# Patient Record
Sex: Male | Born: 1965 | Race: Black or African American | Hispanic: No | Marital: Married | Smoking: Current some day smoker
Health system: Southern US, Community
[De-identification: ages and names within clinical notes are randomized; demographics above are authoritative.]

## PROBLEM LIST (undated history)

## (undated) DIAGNOSIS — M25462 Effusion, left knee: Secondary | ICD-10-CM

## (undated) DIAGNOSIS — M109 Gout, unspecified: Secondary | ICD-10-CM

## (undated) HISTORY — PX: ANTERIOR CRUCIATE LIGAMENT REPAIR: SHX115

---

## 2009-04-03 ENCOUNTER — Emergency Department (HOSPITAL_BASED_OUTPATIENT_CLINIC_OR_DEPARTMENT_OTHER): Admission: EM | Admit: 2009-04-03 | Discharge: 2009-04-03 | Payer: Self-pay | Admitting: Emergency Medicine

## 2009-05-23 ENCOUNTER — Ambulatory Visit: Payer: Self-pay | Admitting: Diagnostic Radiology

## 2009-05-23 ENCOUNTER — Emergency Department (HOSPITAL_BASED_OUTPATIENT_CLINIC_OR_DEPARTMENT_OTHER): Admission: EM | Admit: 2009-05-23 | Discharge: 2009-05-23 | Payer: Self-pay | Admitting: Emergency Medicine

## 2009-07-01 ENCOUNTER — Ambulatory Visit: Payer: Self-pay | Admitting: Diagnostic Radiology

## 2009-07-01 ENCOUNTER — Emergency Department (HOSPITAL_BASED_OUTPATIENT_CLINIC_OR_DEPARTMENT_OTHER): Admission: EM | Admit: 2009-07-01 | Discharge: 2009-07-01 | Payer: Self-pay | Admitting: Emergency Medicine

## 2009-09-01 ENCOUNTER — Emergency Department (HOSPITAL_BASED_OUTPATIENT_CLINIC_OR_DEPARTMENT_OTHER): Admission: EM | Admit: 2009-09-01 | Discharge: 2009-09-01 | Payer: Self-pay | Admitting: Emergency Medicine

## 2010-04-25 LAB — BODY FLUID CULTURE: Culture: NO GROWTH

## 2010-04-25 LAB — SYNOVIAL CELL COUNT + DIFF, W/ CRYSTALS
Monocyte-Macrophage-Synovial Fluid: 1 % — ABNORMAL LOW (ref 50–90)
Neutrophil, Synovial: 99 % — ABNORMAL HIGH (ref 0–25)

## 2010-04-25 LAB — GRAM STAIN

## 2011-04-13 ENCOUNTER — Emergency Department (INDEPENDENT_AMBULATORY_CARE_PROVIDER_SITE_OTHER): Payer: Self-pay

## 2011-04-13 ENCOUNTER — Encounter (HOSPITAL_BASED_OUTPATIENT_CLINIC_OR_DEPARTMENT_OTHER): Payer: Self-pay | Admitting: *Deleted

## 2011-04-13 ENCOUNTER — Emergency Department (HOSPITAL_BASED_OUTPATIENT_CLINIC_OR_DEPARTMENT_OTHER)
Admission: EM | Admit: 2011-04-13 | Discharge: 2011-04-13 | Disposition: A | Discharge status: Home / Self Care | Payer: Self-pay | Attending: Emergency Medicine | Admitting: Emergency Medicine

## 2011-04-13 DIAGNOSIS — R911 Solitary pulmonary nodule: Secondary | ICD-10-CM | POA: Insufficient documentation

## 2011-04-13 DIAGNOSIS — J40 Bronchitis, not specified as acute or chronic: Secondary | ICD-10-CM | POA: Insufficient documentation

## 2011-04-13 DIAGNOSIS — R05 Cough: Secondary | ICD-10-CM

## 2011-04-13 DIAGNOSIS — R079 Chest pain, unspecified: Secondary | ICD-10-CM

## 2011-04-13 DIAGNOSIS — R062 Wheezing: Secondary | ICD-10-CM | POA: Insufficient documentation

## 2011-04-13 DIAGNOSIS — R059 Cough, unspecified: Secondary | ICD-10-CM | POA: Insufficient documentation

## 2011-04-13 DIAGNOSIS — R0602 Shortness of breath: Secondary | ICD-10-CM | POA: Insufficient documentation

## 2011-04-13 DIAGNOSIS — R0989 Other specified symptoms and signs involving the circulatory and respiratory systems: Secondary | ICD-10-CM

## 2011-04-13 DIAGNOSIS — J3489 Other specified disorders of nose and nasal sinuses: Secondary | ICD-10-CM | POA: Insufficient documentation

## 2011-04-13 MED ORDER — IBUPROFEN 400 MG PO TABS
600.0000 mg | ORAL_TABLET | Freq: Once | ORAL | Status: AC
  Administered 2011-04-13: 600 mg via ORAL
  Filled 2011-04-13: qty 1

## 2011-04-13 MED ORDER — ALBUTEROL SULFATE (5 MG/ML) 0.5% IN NEBU
5.0000 mg | INHALATION_SOLUTION | Freq: Once | RESPIRATORY_TRACT | Status: AC
  Administered 2011-04-13: 5 mg via RESPIRATORY_TRACT
  Filled 2011-04-13: qty 1

## 2011-04-13 MED ORDER — ALBUTEROL SULFATE HFA 108 (90 BASE) MCG/ACT IN AERS: 2.0000 | INHALATION_SPRAY | RESPIRATORY_TRACT | Status: AC | PRN

## 2011-04-13 MED ORDER — HYDROCODONE-ACETAMINOPHEN 5-500 MG PO TABS: 1.0000 | ORAL_TABLET | Freq: Four times a day (QID) | ORAL | Status: AC | PRN

## 2011-04-13 MED ORDER — HYDROCODONE-ACETAMINOPHEN 5-325 MG PO TABS
1.0000 | ORAL_TABLET | Freq: Once | ORAL | Status: AC
  Administered 2011-04-13: 1 via ORAL
  Filled 2011-04-13: qty 1

## 2011-04-13 MED ORDER — AZITHROMYCIN 250 MG PO TABS: ORAL_TABLET | ORAL | Status: AC

## 2011-04-13 NOTE — ED Notes (Signed)
Patient states he developed intermittent right upper abdominal pain with radiation into right mid back one week ago.  States pain is now a constant increasing pain which is causing shortness of breath, worse when laying down.  States he has a productive cough with light yellow secretions.  States he had a fever when the pain first started for several days.

## 2011-04-13 NOTE — Discharge Instructions (Signed)
Take antibiotic as prescribed. Use albuterol inhaler as need if wheezing. Take motrin as need. You may also take vicodin as need for pain. No driving for the next 6 hours or when taking vicodin. Also, do not take tylenol or acetaminophen containing medication when taking vicodin.  On your chest xray, our radiologist also made note of a 1.5 cm right lung nodule.  You must follow up closely with primary care doctor in the next couple weeks - discuss the lung nodule and have them arrange a CT scan to further evaulate, and for further follow up (in order to make sure it appears benign, and if appears concerning for malignancy/cancer, to refer to specialist for further evaluation).  Also have them recheck your blood pressure as it is mildly high today.    Return to ER if worse, worsening or severe pain, trouble breathing, other concern.      Bronchitis Bronchitis is the body's way of reacting to injury and/or infection (inflammation) of the bronchi. Bronchi are the air tubes that extend from the windpipe into the lungs. If the inflammation becomes severe, it may cause shortness of breath. CAUSES  Inflammation may be caused by:  A virus.   Germs (bacteria).   Dust.   Allergens.   Pollutants and many other irritants.  The cells lining the bronchial tree are covered with tiny hairs (cilia). These constantly beat upward, away from the lungs, toward the mouth. This keeps the lungs free of pollutants. When these cells become too irritated and are unable to do their job, mucus begins to develop. This causes the characteristic cough of bronchitis. The cough clears the lungs when the cilia are unable to do their job. Without either of these protective mechanisms, the mucus would settle in the lungs. Then you would develop pneumonia. Smoking is a common cause of bronchitis and can contribute to pneumonia. Stopping this habit is the single most important thing you can do to help yourself. TREATMENT    Your caregiver may prescribe an antibiotic if the cough is caused by bacteria. Also, medicines that open up your airways make it easier to breathe. Your caregiver may also recommend or prescribe an expectorant. It will loosen the mucus to be coughed up. Only take over-the-counter or prescription medicines for pain, discomfort, or fever as directed by your caregiver.   Removing whatever causes the problem (smoking, for example) is critical to preventing the problem from getting worse.   Cough suppressants may be prescribed for relief of cough symptoms.   Inhaled medicines may be prescribed to help with symptoms now and to help prevent problems from returning.   For those with recurrent (chronic) bronchitis, there may be a need for steroid medicines.  SEEK IMMEDIATE MEDICAL CARE IF:   During treatment, you develop more pus-like mucus (purulent sputum).   You have a fever.   Your baby is older than 3 months with a rectal temperature of 102 F (38.9 C) or higher.   Your baby is 74 months old or younger with a rectal temperature of 100.4 F (38 C) or higher.   You become progressively more ill.   You have increased difficulty breathing, wheezing, or shortness of breath.  It is necessary to seek immediate medical care if you are elderly or sick from any other disease. MAKE SURE YOU:   Understand these instructions.   Will watch your condition.   Will get help right away if you are not doing well or get worse.  Document Released:  01/23/2005 Document Revised: 01/12/2011 Document Reviewed: 12/03/2007 [facility] Patient Information 2012 [place], [place].    Pulmonary Nodule A pulmonary (lung) nodule is small, round growth in the lung. The size of a pulmonary nodule can be as small as a pencil eraser (1/5 inch or 4 millmeters) to a little bigger than your biggest toenail (1 inch or 25 millimeters). A pulmonary nodule is usually an unplanned finding. It may be found on a chest X-ray or a  computed tomography (CT) scan when you have imaging tests of your lungs done. When a pulmonary nodule is found, tests will be done to determine if the nodule is benign (not cancerous) or malignant (cancerous). Follow-up treatment or testing is based on the size of the pulmonary nodule and your risk of getting lung cancer.  CAUSES Causes of pulmonary nodules can vary.  Benign pulmonary nodules  can be caused from different things. Some of these things include:  Infection. This can be a common cause of a benign pulmonary nodule. The infection may be active (a current infection) or an old infection that is no longer active. Three types of infections can cause a pulmonary nodule. These are:   Bacterial Infection.   Fungal infection.   Viral Infections.   Hematoma. This is a bruise in the lung. A hematoma can happen from an injury to your chest.   Some common diseases can lead to benign pulmonary nodules. For example, rheumatoid arthritis can be a cause of a pulmonary nodule.   Other unusual things can cause a benign pulmonary nodule. These can include:   Having had tuberculosis.   Rare diseases, such as a lung cyst.  Malignant pulmonary nodules.  These are cancerous growths. The cancer may have:  Started in the lung. Some lung cancers first detected as a pulmonary nodule.   Spread to the lung from cancer somewhere else in the body. This is called metastatic cancer.   Certain risk factors make a cancerous pulmonary nodule more likely. They include:   Age. As people get older, a pulmonary nodule is more likely to be cancerous.   Cancer history. If one of your immediate family members has had cancer, you have a higher risk of developing cancer.   Smoking. This includes people who currently smoke and those who have quit.  DIAGNOSIS To diagnose whether a pulmonary nodule is benign or malignant, a variety of tests will be done. This includes things such as:  Health history. Questions  regarding your current health, past health, and family health will be asked.   Blood tests. Results of blood work can show:   Tumor markers for cancer.   Any type of infection.   A skin test called a tuberculin (TB) test may be done. This test can tell if you have been exposed to the germ that causes tuberculosis.   Imaging tests. These take pictures of your lungs. Types of imaging tests include:   Chest X-ray. This can help in several ways. An X-ray gives a close-up look at the pulmonary nodule. A new X-ray can be compared with any X-rays you have had in the past.    Computed tomography  (CT) scan. This test shows smaller pulmonary nodules more clearly than an X-ray.   Positron emission tomography  (PET) scan. This is a test that uses a radioactive substance to identify a pulmonary nodule. A safe amount of radioactive substance is injected into the blood stream. Then, the scan takes a picture of the pulmonary nodule. A malignant  pulmonary nodule will absorb the substance faster than a benign pulmonary nodule. The radioactive substance is eliminated from your body in your urine.   Biopsy.  This removes a tiny piece of the pulmonary nodule so it can be checked under a microscope. Medicine will be given to help keep you relaxed and pain free when a biopsy is done. Types of biopsies include:   Bronchoscopy . This is a surgical procedure. It can be used for pulmonary nodules that are close to the airways in the lung. It uses a scope (a thin tube) with a tiny camera and light on the end. The scope is put in the windpipe. Your caregiver can then see inside the lung. A tiny tool put through the scope is used to take a small sample of the pulmonary nodule tissue.   Transthoracic needle aspiration . This method is used if the pulmonary nodule is far away from the air passages in the lung. A long, thin needle is put through the chest into the lung nodule. A CT scan is done at the same time which can  make it easier to locate the pulmonary nodule.   Surgical lung biopsy . This is a surgical procedure in which the pulmonary nodule is removed. This is usually recommended when the pulmonary nodule is most likely malignant or a biopsy cannot be obtained by either bronchoscopy or transthoracic needle aspiration.  PULMONARY NODULE FOLLOW-UP RECOMMENDATIONS The frequency of pulmonary nodule follow-up is based on your risk factors and size of the pulmonary nodule. If your caregiver suspects the pulmonary nodule is cancerous or the pulmonary nodule changes during any of the follow-up CT scans, additional testing or biopsies will be done.   If you have no or low risk of getting lung cancer (non-smoker, no personal cancer history), recommended follow-up is based on the following pulmonary nodule size:   A pulmonary nodule that is < 4 mm does not require any follow-up.   A pulmonary nodule that is 4 to 6 mm should be re-imaged by CT scan in 12 months.   A pulmonary nodule that is 6 to 8 mm should be re-imaged by CT scan at 6 to 12 months and then again at 18 to 24 months if no change in size.   A pulmonary nodule > 8 mm in size should be followed closely and re-imaged by CT scan at 3, 9, and 24 months.    If you are at risk of getting lung cancer (current or former smoker, family history of cancer), recommended follow-up is based on the following pulmonary nodule size:   A pulmonary nodule that is < 4 mm in size should be re-imaged by CT scan in 12 months.   A pulmonary nodule that is 4 to 6 mm in size should be re-imaged by CT scan at 6 to 12 months and again at 18 to 24 months.   A pulmonary nodule that is 6 to 8 mm in size should be re-imaged by CT scan at 3, 9, and 24 months.   A pulmonary nodule > 8 mm in size should be followed closely and re-imaged by CT scan at 3, 9, and 24 months.  SEEK MEDICAL CARE IF: While waiting for test results to determine what type of pulmonary nodule you have, be  sure to contact your caregiver if you:  Have trouble breathing when you are active.   Feel sick or unusually tired.   Do not feel like eating.   Lose weight  without trying to.   Develop chills or night sweats.   Mild or moderate fevers generally have no long-term effects and often do not require treatment. There are a few exceptions (see below).  SEEK IMMEDIATE MEDICAL CARE IF:  You cannot catch your breath or you begin wheezing.   You cannot stop coughing.   You cough up blood.   You feel like you are going to pass out or become dizzy.   You have sudden chest pain.   You have a fever or persistent symptoms for more than 72 hours.   You have a fever and your symptoms suddenly get worse.  MAKE SURE YOU   Understand these instructions.   Will watch your condition.   Will get help right away if you are not doing well or get worse.  Document Released: 11/20/2008 Document Revised: 01/12/2011 Document Reviewed: 11/20/2008 [facility] Patient Information 2012 [place], [place].

## 2011-04-13 NOTE — ED Provider Notes (Signed)
History     CSN: [id number]  Arrival date & time 04/13/11  1742   First MD Initiated Contact with Patient 04/13/11 1812      Chief Complaint  Patient presents with  . Shortness of Breath    (Consider location/radiation/quality/duration/timing/severity/associated sxs/prior treatment) Patient is a 46 y.o. male presenting with shortness of breath. The history is provided by the patient.  Shortness of Breath  Associated symptoms include cough. Pertinent negatives include no chest pain and no fever.  pt c/o  productive cough in past week, yellowish phlegm. Congestion. No fever. States now when coughs right lateral chest/rib pain. Worse w cough and movement, dull. No sob, but feels wheezy. No hx asthma. Non smoker. Denies sore throat, runny nose or other uri c/o. No known ill contacts.  Denies ant chest pain or discomfort. No leg pain or swelling. No dvt or pe hx. No abd pain. No nv.   History reviewed. No pertinent past medical history.  Past Surgical History  Procedure Date  . Anterior cruciate ligament repair     No family history on file.  History  Substance Use Topics  . Smoking status: Never Smoker   . Smokeless tobacco: Not on file  . Alcohol Use: 1.8 oz/week    3 Cans of beer per week     daily      Review of Systems  Constitutional: Negative for fever.  HENT: Negative for neck pain and neck stiffness.   Eyes: Negative for redness.  Respiratory: Positive for cough.   Cardiovascular: Negative for chest pain and leg swelling.  Gastrointestinal: Negative for abdominal pain.  Genitourinary: Negative for flank pain.  Musculoskeletal: Negative for joint swelling.  Skin: Negative for rash.  Neurological: Negative for headaches.  Hematological: Does not bruise/bleed easily.  Psychiatric/Behavioral: Negative for confusion.    Allergies  Review of patient's allergies indicates no known allergies.  Home Medications   Current Outpatient Rx  Name Route Sig Dispense  Refill  . TYLENOL COLD RELIEF PO Oral Take 5 mLs by mouth every 4 (four) hours as needed. For cold      BP 138/99  Pulse 94  Temp(Src) 98.4 F (36.9 C) (Oral)  Resp 22  Ht 5\' 9"  (1.753 m)  Wt 205 lb (92.987 kg)  BMI 30.27 kg/m2  SpO2 97%  Physical Exam  Nursing note and vitals reviewed. Constitutional: He is oriented to person, place, and time. He appears well-developed and well-nourished. No distress.  HENT:  Head: Atraumatic.  Nose: Nose normal.  Mouth/Throat: Oropharynx is clear and moist.  Eyes: Pupils are equal, round, and reactive to light.  Neck: Neck supple. No tracheal deviation present.  Cardiovascular: Normal rate, regular rhythm, normal heart sounds and intact distal pulses.  Exam reveals no gallop and no friction rub.   No murmur heard. Pulmonary/Chest: Effort normal. No accessory muscle usage. No respiratory distress. He exhibits tenderness.       Coughing, upper resp congestion. Sl wheeze. Chest wall tenderness on right reproducing symptoms, no crepitus.   Abdominal: Soft. Bowel sounds are normal. He exhibits no distension. There is no tenderness.  Genitourinary:       No cva tenderness  Musculoskeletal: Normal range of motion. He exhibits no edema and no tenderness.  Neurological: He is alert and oriented to person, place, and time.  Skin: Skin is warm and dry.  Psychiatric: He has a normal mood and affect.    ED Course  Procedures (including critical care time)  Dg Chest 2  View  04/13/2011  *RADIOLOGY REPORT*  Clinical Data: Congestion and cough  CHEST - 2 VIEW  Comparison: None  Findings:  Heart size appears normal.  No pleural effusion or edema.  No airspace consolidation identified.  Indeterminate nodular density in the right upper lobe measures approximately 1.5 cm.  Left lung appears clear.  IMPRESSION:  1.  Indeterminate nodule within the right midlung. Advise further evaluation with noncontrast CT of the chest.  Original Report Authenticated By: Rosealee Albee, M.D.      MDM  Cxr. Pt has ride, does not have to drive. Motrin po. vicodin 1 po. Alb neb. Cxr.   Recheck no wheezing or increased wob. Will rx bronchitis, mild bronchospasm.   Discussed right lung nodule w pt and need for close pcp f/u, outpt ct, to r/o benign nodule verses cancer.        Suzi Roots, MD 04/13/11 [place] [facility]

## 2011-09-11 ENCOUNTER — Emergency Department (HOSPITAL_BASED_OUTPATIENT_CLINIC_OR_DEPARTMENT_OTHER): Payer: Self-pay

## 2011-09-11 ENCOUNTER — Encounter (HOSPITAL_BASED_OUTPATIENT_CLINIC_OR_DEPARTMENT_OTHER): Payer: Self-pay | Admitting: *Deleted

## 2011-09-11 ENCOUNTER — Emergency Department (HOSPITAL_BASED_OUTPATIENT_CLINIC_OR_DEPARTMENT_OTHER)
Admission: EM | Admit: 2011-09-11 | Discharge: 2011-09-11 | Disposition: A | Discharge status: Home / Self Care | Payer: Self-pay | Attending: Emergency Medicine | Admitting: Emergency Medicine

## 2011-09-11 DIAGNOSIS — Z96659 Presence of unspecified artificial knee joint: Secondary | ICD-10-CM | POA: Insufficient documentation

## 2011-09-11 DIAGNOSIS — M109 Gout, unspecified: Secondary | ICD-10-CM | POA: Insufficient documentation

## 2011-09-11 DIAGNOSIS — M1710 Unilateral primary osteoarthritis, unspecified knee: Secondary | ICD-10-CM | POA: Insufficient documentation

## 2011-09-11 HISTORY — DX: Gout, unspecified: M10.9

## 2011-09-11 MED ORDER — IBUPROFEN 600 MG PO TABS: 600.0000 mg | ORAL_TABLET | Freq: Four times a day (QID) | ORAL | Status: AC | PRN

## 2011-09-11 MED ORDER — OXYCODONE-ACETAMINOPHEN 5-325 MG PO TABS: 1.0000 | ORAL_TABLET | Freq: Four times a day (QID) | ORAL | Status: AC | PRN

## 2011-09-11 MED ORDER — OXYCODONE-ACETAMINOPHEN 5-325 MG PO TABS
1.0000 | ORAL_TABLET | Freq: Once | ORAL | Status: DC
Start: 2011-09-11 — End: 2011-09-11
  Filled 2011-09-11: qty 1

## 2011-09-11 NOTE — ED Notes (Signed)
Patient not given oxycodone, took prescription for pain meds at home

## 2011-09-11 NOTE — ED Notes (Signed)
Left knee pain since last night. At work today knee started swelling. Hx of fluid on the same knee last year.

## 2011-09-11 NOTE — ED Provider Notes (Signed)
History  This chart was scribed for Ethelda Chick, MD by Erskine Emery. This patient was seen in room MH03/MH03 and the patient's care was started at 15:52.   CSN: [id number]  Arrival date & time 09/11/11  1436   First MD Initiated Contact with Patient 09/11/11 1552      Chief Complaint  Patient presents with  . Knee Pain    (Consider location/radiation/quality/duration/timing/severity/associated sxs/prior treatment) HPI Lawrence [name] is a 46 y.o. male who presents to the Emergency Department complaining of fluid build up on the left knee since this afternoon and left knee pain since last night. Pt denies any injury to that knee or any fevers. Pt reports previous episodes of similar symptoms (last year). Pt reports a h/o gout that usually flares up in the foot or ankle but has occasionally flared up in the knee. Pt also has a h/o knee replacement surgery on the right knee. Pt has no known allergies.      Past Medical History  Diagnosis Date  . Gout     Past Surgical History  Procedure Date  . Anterior cruciate ligament repair     No family history on file.  History  Substance Use Topics  . Smoking status: Never Smoker   . Smokeless tobacco: Not on file  . Alcohol Use: 1.8 oz/week    3 Cans of beer per week     daily      Review of Systems  Constitutional: Negative for fever and chills.  Respiratory: Negative for shortness of breath.   Gastrointestinal: Negative for nausea and vomiting.  Musculoskeletal: Positive for joint swelling.       Left knee pain  Neurological: Negative for weakness.      Allergies  Review of patient's allergies indicates no known allergies.  Home Medications   Current Outpatient Rx  Name Route Sig Dispense Refill  . ALBUTEROL SULFATE HFA 108 (90 BASE) MCG/ACT IN AERS Inhalation Inhale 2 puffs into the lungs every 4 (four) hours as needed for wheezing. 1 Inhaler 0  . TYLENOL COLD RELIEF PO Oral Take 5 mLs by mouth every 4 (four)  hours as needed. For cold    . IBUPROFEN 600 MG PO TABS Oral Take 1 tablet (600 mg total) by mouth every 6 (six) hours as needed for pain. 30 tablet 0  . OXYCODONE-ACETAMINOPHEN 5-325 MG PO TABS Oral Take 1-2 tablets by mouth every 6 (six) hours as needed for pain. 15 tablet 0    Triage Vitals: BP 180/101  Pulse 80  Temp 98.2 F (36.8 C) (Oral)  Resp 20  SpO2 100%  Physical Exam  Nursing note and vitals reviewed. Constitutional: He is oriented to person, place, and time. He appears well-developed and well-nourished. No distress.  HENT:  Head: Normocephalic and atraumatic.  Eyes: EOM are normal.  Neck: Neck supple. No tracheal deviation present.  Cardiovascular: Normal rate.   Pulmonary/Chest: Effort normal. No respiratory distress.  Musculoskeletal: Normal range of motion. He exhibits edema.       Left knee: palpable effusion with swelling but no erythema overlying. Not warm. Distally, neuro vascular is intact.   Neurological: He is alert and oriented to person, place, and time.  Skin: Skin is warm and dry.  Psychiatric: He has a normal mood and affect. His behavior is normal.    ED Course  Procedures (including critical care time) DIAGNOSTIC STUDIES: Oxygen Saturation is 100% on room air, normal by my interpretation.    COORDINATION OF CARE:  16:17--I evaluated the patient and we discussed a treatment plan including x-ray, medication, and wrap-up to which the pt agreed. I instructed the pt to follow up with an orthopedist if his symptoms do not improve.   16:30--Medication order: Oxycodone-acetaminophen (Percocet/Roxicet) 5-325 mg per tablet, 1 tablet--once   Labs Reviewed - No data to display Dg Knee Complete 4 Views Left  09/11/2011  *RADIOLOGY REPORT*  Clinical Data: History of left knee pain.  Swelling.  Pain around patella.  History of fluid on left knee.  LEFT KNEE - COMPLETE 4+ VIEW  Comparison: None.  Findings: On the lateral image there is increased density in the  suprapatellar region consistent with joint effusion.  Posterior patellar osteophyte formation is seen.  There is slight narrowing of the medial joint space with marginal osteophyte formation consistent with osteoarthritic degenerative joint changes.  There is a calcification projecting within the joint. I am concerned this may reflect loose body.  No fracture or bony destruction is seen.  IMPRESSION: History consistent with joint effusion.  Posterior patellar degenerative spurring.  Slight narrowing of medial joint space with marginal osteophyte formation consistent with degenerative joint osteoarthritic changes.  Calcific density projects within the joint.  I am concerned this may reflect loose body.  Original Report Authenticated By: Crawford Givens, M.D.     1. Knee osteoarthritis       MDM  Pt with c/o pain in left knee with swelling.  Symptoms began earlier today.  No fever, no redness or warmth overlying knee.  Xray reassuring but does show evidence of osteoarthriits.  Pt discharged with strict return precautions, given knee immobilizer and pain medications.  Discharged with strict return precautions.  Pt agreeable with plan.   I personally performed the services described in this documentation, which was scribed in my presence. The recorded information has been reviewed and considered.    Ethelda Chick, MD 09/11/11 [phone]

## 2012-05-12 ENCOUNTER — Emergency Department (HOSPITAL_BASED_OUTPATIENT_CLINIC_OR_DEPARTMENT_OTHER)
Admission: EM | Admit: 2012-05-12 | Discharge: 2012-05-12 | Disposition: A | Discharge status: Home / Self Care | Payer: Self-pay | Attending: Emergency Medicine | Admitting: Emergency Medicine

## 2012-05-12 ENCOUNTER — Encounter (HOSPITAL_BASED_OUTPATIENT_CLINIC_OR_DEPARTMENT_OTHER): Payer: Self-pay

## 2012-05-12 DIAGNOSIS — Z8639 Personal history of other endocrine, nutritional and metabolic disease: Secondary | ICD-10-CM | POA: Insufficient documentation

## 2012-05-12 DIAGNOSIS — Z202 Contact with and (suspected) exposure to infections with a predominantly sexual mode of transmission: Secondary | ICD-10-CM | POA: Insufficient documentation

## 2012-05-12 DIAGNOSIS — I10 Essential (primary) hypertension: Secondary | ICD-10-CM | POA: Insufficient documentation

## 2012-05-12 DIAGNOSIS — Z862 Personal history of diseases of the blood and blood-forming organs and certain disorders involving the immune mechanism: Secondary | ICD-10-CM | POA: Insufficient documentation

## 2012-05-12 DIAGNOSIS — R209 Unspecified disturbances of skin sensation: Secondary | ICD-10-CM | POA: Insufficient documentation

## 2012-05-12 LAB — URINALYSIS, ROUTINE W REFLEX MICROSCOPIC
Bilirubin Urine: NEGATIVE
Specific Gravity, Urine: 1.018 (ref 1.005–1.030)

## 2012-05-12 LAB — URINE MICROSCOPIC-ADD ON

## 2012-05-12 MED ORDER — CEFTRIAXONE SODIUM 250 MG IJ SOLR
250.0000 mg | Freq: Once | INTRAMUSCULAR | Status: AC
  Administered 2012-05-12: 250 mg via INTRAMUSCULAR
  Filled 2012-05-12: qty 250

## 2012-05-12 MED ORDER — AZITHROMYCIN 250 MG PO TABS
1000.0000 mg | ORAL_TABLET | Freq: Once | ORAL | Status: AC
  Administered 2012-05-12: 1000 mg via ORAL
  Filled 2012-05-12: qty 4

## 2012-05-12 NOTE — ED Notes (Signed)
Pt states that he has a tingling sensation when he urinates, states that a condom broke during sexual activity last week, onset of tingling sensation shortly after this.  Pt denies any penile discharge.

## 2012-05-12 NOTE — ED Provider Notes (Signed)
History     CSN: [id number]  Arrival date & time 05/12/12  4098   First MD Initiated Contact with Patient 05/12/12 [phone]      Chief Complaint  Patient presents with  . Exposure to STD    (Consider location/radiation/quality/duration/timing/severity/associated sxs/prior treatment) Patient is a 47 y.o. [name] presenting with STD exposure. The history is provided by the patient.  Exposure to STD Pertinent negatives include no chest pain, no headaches and no shortness of breath.   patient with STD exposure one week ago. No specific symptoms other than tingling at the tip of penis. No discharge no lesions. No bowel pain no nausea vomiting no fevers.  Past Medical History  Diagnosis Date  . Gout     Past Surgical History  Procedure Laterality Date  . Anterior cruciate ligament repair      History reviewed. No pertinent family history.  History  Substance Use Topics  . Smoking status: Never Smoker   . Smokeless tobacco: Never Used  . Alcohol Use: 1.8 oz/week    3 Cans of beer per week     Comment: daily      Review of Systems  Constitutional: Negative for fever.  HENT: Negative for congestion.   Eyes: Negative for redness.  Respiratory: Negative for shortness of breath.   Cardiovascular: Negative for chest pain.  Genitourinary: Negative for dysuria, urgency, hematuria, penile pain and testicular pain.  Neurological: Negative for headaches.  Hematological: Does not bruise/bleed easily.  Psychiatric/Behavioral: Negative for confusion.    Allergies  Review of patient's allergies indicates no known allergies.  Home Medications   Current Outpatient Rx  Name  Route  Sig  Dispense  Refill  . Diphenhydramine-Acetaminophen (TYLENOL COLD RELIEF PO)   Oral   Take 5 mLs by mouth every 4 (four) hours as needed. For cold           BP 170/103  Pulse 84  Temp(Src) 98.5 F (Lawrence.9 C) (Oral)  Resp 18  SpO2 97%  Physical Exam  Constitutional: He is oriented to person,  place, and time. He appears well-developed and well-nourished. No distress.  HENT:  Head: Normocephalic and atraumatic.  Mouth/Throat: Oropharynx is clear and moist.  Eyes: Conjunctivae and EOM are normal. Pupils are equal, round, and reactive to light.  Neck: Normal range of motion.  Pulmonary/Chest: Effort normal and breath sounds normal. No respiratory distress.  Abdominal: Soft. Bowel sounds are normal. He exhibits no distension.  Genitourinary: Penis normal. No penile tenderness.  Musculoskeletal: Normal range of motion.  Neurological: He is alert and oriented to person, place, and time. No cranial nerve deficit. He exhibits normal muscle tone. Coordination normal.  Skin: Skin is warm. No rash noted.    ED Course  Procedures (including critical care time)  Labs Reviewed  URINALYSIS, ROUTINE W REFLEX MICROSCOPIC - Abnormal; Notable for the following:    Hgb urine dipstick SMALL (*)    Leukocytes, UA SMALL (*)    All other components within normal limits  URINE MICROSCOPIC-ADD ON   No results found. Results for orders placed during the hospital encounter of 05/12/12  URINALYSIS, ROUTINE W REFLEX MICROSCOPIC      Result Value Range   Color, Urine YELLOW  YELLOW   APPearance CLEAR  CLEAR   Specific Gravity, Urine 1.018  1.005 - 1.030   pH 6.0  5.0 - 8.0   Glucose, UA NEGATIVE  NEGATIVE mg/dL   Hgb urine dipstick SMALL (*) NEGATIVE   Bilirubin Urine NEGATIVE  NEGATIVE   Ketones, ur NEGATIVE  NEGATIVE mg/dL   Protein, ur NEGATIVE  NEGATIVE mg/dL   Urobilinogen, UA 0.2  0.0 - 1.0 mg/dL   Nitrite NEGATIVE  NEGATIVE   Leukocytes, UA SMALL (*) NEGATIVE  URINE MICROSCOPIC-ADD ON      Result Value Range   Squamous Epithelial / LPF RARE  RARE   WBC, UA 0-2  <3 WBC/hpf   RBC / HPF 0-2  <3 RBC/hpf   Bacteria, UA RARE  RARE     1. Possible exposure to STD   2. Hypertension       MDM   Urinalysis negative for urinary tract infection. Patient without any penile lesions or  discharge however is concerned about STD exposure so treated here in the emergency department with Rocephin IM and Zithromax 1 g by mouth. Patient's blood pressure is high here recommend that he have that rechecked in a week he may have undiagnosed hypertension.       Lawrence Jakes, MD 05/12/12 [phone]

## 2013-05-23 ENCOUNTER — Emergency Department (HOSPITAL_BASED_OUTPATIENT_CLINIC_OR_DEPARTMENT_OTHER)
Admission: EM | Admit: 2013-05-23 | Discharge: 2013-05-23 | Disposition: A | Discharge status: Home / Self Care | Payer: BC Managed Care – PPO | Attending: Emergency Medicine | Admitting: Emergency Medicine

## 2013-05-23 ENCOUNTER — Encounter (HOSPITAL_BASED_OUTPATIENT_CLINIC_OR_DEPARTMENT_OTHER): Payer: Self-pay | Admitting: Emergency Medicine

## 2013-05-23 DIAGNOSIS — M25469 Effusion, unspecified knee: Secondary | ICD-10-CM | POA: Insufficient documentation

## 2013-05-23 DIAGNOSIS — Z8639 Personal history of other endocrine, nutritional and metabolic disease: Secondary | ICD-10-CM | POA: Insufficient documentation

## 2013-05-23 DIAGNOSIS — Z862 Personal history of diseases of the blood and blood-forming organs and certain disorders involving the immune mechanism: Secondary | ICD-10-CM | POA: Insufficient documentation

## 2013-05-23 DIAGNOSIS — M25462 Effusion, left knee: Secondary | ICD-10-CM

## 2013-05-23 HISTORY — DX: Effusion, left knee: M25.462

## 2013-05-23 MED ORDER — PREDNISONE 10 MG PO TABS: ORAL_TABLET | ORAL | Status: DC

## 2013-05-23 MED ORDER — HYDROCODONE-ACETAMINOPHEN 5-325 MG PO TABS: 2.0000 | ORAL_TABLET | ORAL | Status: DC | PRN

## 2013-05-23 NOTE — ED Provider Notes (Signed)
CSN: [id number]     Arrival date & time 05/23/13  1237 History   First MD Initiated Contact with Patient 05/23/13 1249     Chief Complaint  Patient presents with  . Knee Pain     (Consider location/radiation/quality/duration/timing/severity/associated sxs/prior Treatment) Patient is a 48 y.o. male presenting with knee pain. The history is provided by the patient. No language interpreter was used.  Knee Pain Location:  Knee Injury: no   Knee location:  L knee Pain details:    Quality:  Aching   Radiates to:  Does not radiate   Severity:  No pain   Timing:  Constant   Progression:  Worsening Chronicity:  Recurrent Relieved by:  Nothing Worsened by:  Nothing tried Ineffective treatments:  None tried Pt has had knee swell multiple times in the past.  Pt reports he had fluid drained here about 3 years ago.   Pt has had gout in multiple areas.   Past Medical History  Diagnosis Date  . Gout   . Knee effusion, left    Past Surgical History  Procedure Laterality Date  . Anterior cruciate ligament repair     No family history on file. History  Substance Use Topics  . Smoking status: Never Smoker   . Smokeless tobacco: Never Used  . Alcohol Use: 1.8 oz/week    3 Cans of beer per week     Comment: daily    Review of Systems  All other systems reviewed and are negative.     Allergies  Review of patient's allergies indicates no known allergies.  Home Medications   Prior to Admission medications   Medication Sig Start Date End Date Taking? Authorizing Provider  Diphenhydramine-Acetaminophen (TYLENOL COLD RELIEF PO) Take 5 mLs by mouth every 4 (four) hours as needed. For cold    Historical Provider, MD  HYDROcodone-acetaminophen (NORCO/VICODIN) 5-325 MG per tablet Take 2 tablets by mouth every 4 (four) hours as needed. 05/23/13   Elson AreasLeslie K Sofia, PA-C  predniSONE (DELTASONE) 10 MG tablet 6,5,4,3,2,1 taper 05/23/13   Elson AreasLeslie K Sofia, PA-C   BP 168/105  Pulse 83  Temp(Src)  97.9 F (36.6 C) (Oral)  Resp 16  Ht 5\' 9"  (1.753 m)  Wt 195 lb (88.451 kg)  BMI 28.78 kg/m2  SpO2 96% Physical Exam  Constitutional: He is oriented to person, place, and time. He appears well-developed and well-nourished.  Musculoskeletal: He exhibits tenderness.  Swollen left knee,  No erythema. From,  Ns and nv intact  Neurological: He is alert and oriented to person, place, and time. He has normal reflexes.  Skin: Skin is warm.  Psychiatric: He has a normal mood and affect.    ED Course  Procedures (including critical care time) Labs Review Labs Reviewed - No data to display  Imaging Review No results found.   EKG Interpretation None     Pt reports he wants fluid drained.  I advised I do not think he has a septic joint.   I advised him draining fluid will not solve problem, that it will reaccumulate.   MDM   Final diagnoses:  Knee effusion, left    I suspect gout.  I do not think pt has a septic joint.  No fever, no illness  Pt placed in knee immbolizer,   I will treat with prednisone and hydrocodone.   Pt advised to recheck with Dr. Pearletha ForgeHudnall. Pt also given Dr. Shon BatonBrooks number.    Elson AreasLeslie K Sofia, PA-C 05/23/13 1415  Verlon AuLeslie  Verline LemaK Sofia, PA-C 05/23/13 1417  Elson AreasLeslie K Sofia, PA-C 05/23/13 1435

## 2013-05-23 NOTE — ED Notes (Signed)
Left knee pain and swelling x 2 days.

## 2013-05-23 NOTE — ED Provider Notes (Deleted)
CSN: [id number]     Arrival date & time 05/23/13  1237 History   First MD Initiated Contact with Patient 05/23/13 1249     Chief Complaint  Patient presents with  . Knee Pain     (Consider location/radiation/quality/duration/timing/severity/associated sxs/prior Treatment) HPI  Past Medical History  Diagnosis Date  . Gout   . Knee effusion, left    Past Surgical History  Procedure Laterality Date  . Anterior cruciate ligament repair     No family history on file. History  Substance Use Topics  . Smoking status: Never Smoker   . Smokeless tobacco: Never Used  . Alcohol Use: 1.8 oz/week    3 Cans of beer per week     Comment: daily    Review of Systems    Allergies  Review of patient's allergies indicates no known allergies.  Home Medications   Prior to Admission medications   Medication Sig Start Date End Date Taking? Authorizing Provider  Diphenhydramine-Acetaminophen (TYLENOL COLD RELIEF PO) Take 5 mLs by mouth every 4 (four) hours as needed. For cold    Historical Provider, MD  HYDROcodone-acetaminophen (NORCO/VICODIN) 5-325 MG per tablet Take 2 tablets by mouth every 4 (four) hours as needed. 05/23/13   Elson AreasLeslie K Sofia, PA-C  predniSONE (DELTASONE) 10 MG tablet 6,5,4,3,2,1 taper 05/23/13   Elson AreasLeslie K Sofia, PA-C   BP 168/105  Pulse 83  Temp(Src) 97.9 F (36.6 C) (Oral)  Resp 16  Ht 5\' 9"  (1.753 m)  Wt 195 lb (88.451 kg)  BMI 28.78 kg/m2  SpO2 96% Physical Exam  ED Course  Procedures (including critical care time) Labs Review Labs Reviewed - No data to display  Imaging Review No results found.   EKG Interpretation None      MDM   Final diagnoses:  Knee effusion, left    Prednisone Hydrocodone Follow up with Dr. Allena Napoleonhudnall    Leslie K Sofia, PA-C 05/23/13 1416

## 2013-05-23 NOTE — Discharge Instructions (Signed)

## 2013-05-23 NOTE — ED Provider Notes (Signed)
Medical screening examination/treatment/procedure(s) were performed by non-physician practitioner and as supervising physician I was immediately available for consultation/collaboration.   EKG Interpretation None        Chanel Mckesson, MD 05/23/13 1750 

## 2013-06-05 ENCOUNTER — Emergency Department (HOSPITAL_BASED_OUTPATIENT_CLINIC_OR_DEPARTMENT_OTHER): Payer: BC Managed Care – PPO

## 2013-06-05 ENCOUNTER — Encounter (HOSPITAL_BASED_OUTPATIENT_CLINIC_OR_DEPARTMENT_OTHER): Payer: Self-pay | Admitting: Emergency Medicine

## 2013-06-05 ENCOUNTER — Emergency Department (HOSPITAL_BASED_OUTPATIENT_CLINIC_OR_DEPARTMENT_OTHER)
Admission: EM | Admit: 2013-06-05 | Discharge: 2013-06-05 | Disposition: A | Discharge status: Home / Self Care | Payer: BC Managed Care – PPO | Attending: Emergency Medicine | Admitting: Emergency Medicine

## 2013-06-05 DIAGNOSIS — M109 Gout, unspecified: Secondary | ICD-10-CM | POA: Insufficient documentation

## 2013-06-05 DIAGNOSIS — M25539 Pain in unspecified wrist: Secondary | ICD-10-CM | POA: Insufficient documentation

## 2013-06-05 LAB — BASIC METABOLIC PANEL
BUN: 14 mg/dL (ref 6–23)
CALCIUM: 9.4 mg/dL (ref 8.4–10.5)
CO2: 25 meq/L (ref 19–32)
CREATININE: 1 mg/dL (ref 0.50–1.35)
Chloride: 99 mEq/L (ref 96–112)
GFR calc Af Amer: >90 mL/min (ref >90)
GFR calc non Af Amer: 86 mL/min — ABNORMAL LOW (ref >90)
Glucose, Bld: 122 mg/dL — ABNORMAL HIGH (ref 70–99)
POTASSIUM: 4 meq/L (ref 3.7–5.3)
Sodium: 137 mEq/L (ref 137–147)

## 2013-06-05 MED ORDER — PREDNISONE 20 MG PO TABS: 60.0000 mg | ORAL_TABLET | Freq: Every day | ORAL | Status: DC

## 2013-06-05 MED ORDER — INDOMETHACIN 25 MG PO CAPS: 25.0000 mg | ORAL_CAPSULE | Freq: Three times a day (TID) | ORAL | Status: DC | PRN

## 2013-06-05 MED ORDER — ALLOPURINOL 100 MG PO TABS: 100.0000 mg | ORAL_TABLET | Freq: Every day | ORAL | Status: DC

## 2013-06-05 MED ORDER — IBUPROFEN 800 MG PO TABS
800.0000 mg | ORAL_TABLET | Freq: Once | ORAL | Status: AC
  Administered 2013-06-05: 800 mg via ORAL
  Filled 2013-06-05: qty 1

## 2013-06-05 MED ORDER — PREDNISONE 50 MG PO TABS
60.0000 mg | ORAL_TABLET | Freq: Once | ORAL | Status: AC
  Administered 2013-06-05: 60 mg via ORAL
  Filled 2013-06-05 (×2): qty 1

## 2013-06-05 MED ORDER — HYDROCODONE-ACETAMINOPHEN 5-325 MG PO TABS: 1.0000 | ORAL_TABLET | ORAL | Status: DC | PRN

## 2013-06-05 NOTE — ED Notes (Signed)
BP elevated.  Discussed monitoring BP x 5 days, keeping a journal and following up with a PMD for further evaluation.  Pt verbalizes understanding.

## 2013-06-05 NOTE — ED Notes (Addendum)
Patient has a history of gout.  States he developed swelling, pain and warmth to his right hand and wrist yesterday.  Was seen one week ago with gout in his right knee, which has improved.

## 2013-06-05 NOTE — ED Provider Notes (Signed)
TIME SEEN: 9:32 AM  CHIEF COMPLAINT: Right hand and wrist pain  HPI: Patient is a right-hand-dominant 48 y.o. M with history of gout who presents emergency department with right wrist and hand pain that started yesterday. He is having swelling and pain with even minimal movement. He states he has had gout before and has had gout in his hand and wrist before. He states this feels similar to his prior episodes of gout. He was just treated for gout in his left knee with hydrocodone and prednisone which she states significantly improved his symptoms and his left knee effusion and pain is now completely gone. He denies a history of injury. No fevers, chills, vomiting or diarrhea. He is not sure what his creatinine is normally. He states that he used to take indomethacin and allopurinol which helped keep his gout attacks away.  ROS: See HPI Constitutional: no fever  Eyes: no drainage  ENT: no runny nose   Cardiovascular:  no chest pain  Resp: no SOB  GI: no vomiting GU: no dysuria Integumentary: no rash  Allergy: no hives  Musculoskeletal: no leg swelling  Neurological: no slurred speech ROS otherwise negative  PAST MEDICAL HISTORY/PAST SURGICAL HISTORY:  Past Medical History  Diagnosis Date  . Gout   . Knee effusion, left     MEDICATIONS:  Prior to Admission medications   Medication Sig Start Date End Date Taking? Authorizing Provider  Diphenhydramine-Acetaminophen (TYLENOL COLD RELIEF PO) Take 5 mLs by mouth every 4 (four) hours as needed. For cold    Historical Provider, MD  HYDROcodone-acetaminophen (NORCO/VICODIN) 5-325 MG per tablet Take 2 tablets by mouth every 4 (four) hours as needed. 05/23/13   Elson AreasLeslie K Sofia, PA-C  predniSONE (DELTASONE) 10 MG tablet 6,5,4,3,2,1 taper 05/23/13   Elson AreasLeslie K Sofia, PA-C    ALLERGIES:  No Known Allergies  SOCIAL HISTORY:  History  Substance Use Topics  . Smoking status: Never Smoker   . Smokeless tobacco: Never Used  . Alcohol Use: 1.8  oz/week    3 Cans of beer per week     Comment: daily    FAMILY HISTORY: No family history on file.  EXAM: BP 159/111  Pulse 99  Temp(Src) 97.8 F (36.6 C) (Oral)  Resp 18  Ht 5\' 9"  (1.753 m)  Wt 195 lb (88.451 kg)  BMI 28.78 kg/m2  SpO2 100% CONSTITUTIONAL: Alert and oriented and responds appropriately to questions. Well-appearing; well-nourished HEAD: Normocephalic EYES: Conjunctivae clear, PERRL ENT: normal nose; no rhinorrhea; moist mucous membranes; pharynx without lesions noted NECK: Supple, no meningismus, no LAD  CARD: RRR; S1 and S2 appreciated; no murmurs, no clicks, no rubs, no gallops RESP: Normal chest excursion without splinting or tachypnea; breath sounds clear and equal bilaterally; no wheezes, no rhonchi, no rales,  ABD/GI: Normal bowel sounds; non-distended; soft, non-tender, no rebound, no guarding BACK:  The back appears normal and is non-tender to palpation, there is no CVA tenderness EXT: Patient has swelling and warmth to his right dorsal hand and right wrist diffusely with no erythema or induration or fluctuance, pain with any range of motion of the right wrist but the range of motion in the fingers and elbows and shoulder of the right arm, 2+ refill pulses bilaterally, sensation to light touch intact diffusely, otherwise Normal ROM in all joints; non-tender to palpation; no edema; normal capillary refill; no cyanosis    SKIN: Normal color for age and race; warm NEURO: Moves all extremities equally PSYCH: The patient's mood and manner  are appropriate. Grooming and personal hygiene are appropriate.  MEDICAL DECISION MAKING: Patient here with likely gout attack of his right wrist. I am not concerned for septic arthritis at this time. He states he has had gout in his wrist before. He has not had imaging done recently of his hand and wrist, we'll perform this today. We'll also check patient's creatinine is he states that indomethacin is normally help with his pain.  He is also requesting another round of steroids as his states this helps relieve his pain the fastest.  He is nondiabetic. He drove himself to the emergency department and would like to drive home.  ED PROGRESS: X-rays show no acute injury. His creatinine is normal at 1.0. We'll discharge with prescription for indomethacin, prednisone, allopurinol and Vicodin. Have discussed return precautions and supportive care instructions. Patient verbalizes understanding and is comfortable with this plan.     Layla MawKristen N Ward, DO 06/05/13 1015

## 2013-06-05 NOTE — Discharge Instructions (Signed)
Purine Restricted Diet A low-purine diet consists of foods that reduce uric acid made in your body. INDICATIONS FOR USE  Your caregiver may ask you to follow a low-purine diet to reduce gout flairs.  GUIDELINES  Avoid high-purine foods, including all alcohol, yeast extracts taken as supplements, and sauces made from meats (like gravy). Do not eat high-purine meats, including anchovies, sardines, herring, mussels, tuna, codfish, scallops, trout, haddock, bacon, organ meats, tripe, goose, wild game, and sweetbreads.  Grains  Allowed/Recommended: All, except those listed to consume in moderation.  Consume in Moderation: Oatmeal ( cup uncooked daily), wheat bran or germ ( cup daily), and whole grains. Vegetables  Allowed/Recommended: All, except those listed to consume in moderation.  Consume in Moderation: Asparagus, cauliflower, spinach, mushrooms, and green peas ( cup daily). Fruit  Allowed/Recommended: All.  Consume in Moderation: None. Meat and Meat Substitutes  Allowed/Recommended: Eggs, nuts, and peanut butter.  Consume in Moderation: Limit to 4 to 6 oz daily. Avoid high-purine meats. Lentils, peas, and dried beans (1 cup daily). Milk  Allowed/Recommended: All. Choose low-fat or skim when possible.  Consume in Moderation: None. Fats and Oils  Allowed/Recommended: All.  Consume in Moderation: None. Beverages  Allowed/Recommended: All, except those listed to avoid.  Avoid: All alcohol. Condiments/Miscellaneous  Allowed/Recommended: All, except those listed to consume in moderation.  Consume in Moderation: Bouillon and meat-based broths and soups. Document Released: 05/20/2010 Document Revised: 04/17/2011 Document Reviewed: 05/20/2010 [facility] Patient Information 2014 [employer], [place].  Gout Gout is an inflammatory arthritis caused by a buildup of uric acid crystals in the joints. Uric acid is a chemical that is normally present in the blood. When the level of uric  acid in the blood is too high it can form crystals that deposit in your joints and tissues. This causes joint redness, soreness, and swelling (inflammation). Repeat attacks are common. Over time, uric acid crystals can form into masses (tophi) near a joint, destroying bone and causing disfigurement. Gout is treatable and often preventable. CAUSES  The disease begins with elevated levels of uric acid in the blood. Uric acid is produced by your body when it breaks down a naturally found substance called purines. Certain foods you eat, such as meats and fish, contain high amounts of purines. Causes of an elevated uric acid level include:  Being passed down from parent to child (heredity).  Diseases that cause increased uric acid production (such as obesity, psoriasis, and certain cancers).  Excessive alcohol use.  Diet, especially diets rich in meat and seafood.  Medicines, including certain cancer-fighting medicines (chemotherapy), water pills (diuretics), and aspirin.  Chronic kidney disease. The kidneys are no longer able to remove uric acid well.  Problems with metabolism. Conditions strongly associated with gout include:  Obesity.  High blood pressure.  High cholesterol.  Diabetes. Not everyone with elevated uric acid levels gets gout. It is not understood why some people get gout and others do not. Surgery, joint injury, and eating too much of certain foods are some of the factors that can lead to gout attacks. SYMPTOMS   An attack of gout comes on quickly. It causes intense pain with redness, swelling, and warmth in a joint.  Fever can occur.  Often, only one joint is involved. Certain joints are more commonly involved:  Base of the big toe.  Knee.  Ankle.  Wrist.  Finger. Without treatment, an attack usually goes away in a few days to weeks. Between attacks, you usually will not have symptoms, which is  different from many other forms of arthritis. DIAGNOSIS  Your  caregiver will suspect gout based on your symptoms and exam. In some cases, tests may be recommended. The tests may include:  Blood tests.  Urine tests.  X-rays.  Joint fluid exam. This exam requires a needle to remove fluid from the joint (arthrocentesis). Using a microscope, gout is confirmed when uric acid crystals are seen in the joint fluid. TREATMENT  There are two phases to gout treatment: treating the sudden onset (acute) attack and preventing attacks (prophylaxis).  Treatment of an Acute Attack.  Medicines are used. These include anti-inflammatory medicines or steroid medicines.  An injection of steroid medicine into the affected joint is sometimes necessary.  The painful joint is rested. Movement can worsen the arthritis.  You may use warm or cold treatments on painful joints, depending which works best for you.  Treatment to Prevent Attacks.  If you suffer from frequent gout attacks, your caregiver may advise preventive medicine. These medicines are started after the acute attack subsides. These medicines either help your kidneys eliminate uric acid from your body or decrease your uric acid production. You may need to stay on these medicines for a very long time.  The early phase of treatment with preventive medicine can be associated with an increase in acute gout attacks. For this reason, during the first few months of treatment, your caregiver may also advise you to take medicines usually used for acute gout treatment. Be sure you understand your caregiver's directions. Your caregiver may make several adjustments to your medicine dose before these medicines are effective.  Discuss dietary treatment with your caregiver or dietitian. Alcohol and drinks high in sugar and fructose and foods such as meat, poultry, and seafood can increase uric acid levels. Your caregiver or dietician can advise you on drinks and foods that should be limited. HOME CARE INSTRUCTIONS   Do not  take aspirin to relieve pain. This raises uric acid levels.  Only take over-the-counter or prescription medicines for pain, discomfort, or fever as directed by your caregiver.  Rest the joint as much as possible. When in bed, keep sheets and blankets off painful areas.  Keep the affected joint raised (elevated).  Apply warm or cold treatments to painful joints. Use of warm or cold treatments depends on which works best for you.  Use crutches if the painful joint is in your leg.  Drink enough fluids to keep your urine clear or pale yellow. This helps your body get rid of uric acid. Limit alcohol, sugary drinks, and fructose drinks.  Follow your dietary instructions. Pay careful attention to the amount of protein you eat. Your daily diet should emphasize fruits, vegetables, whole grains, and fat-free or low-fat milk products. Discuss the use of coffee, vitamin C, and cherries with your caregiver or dietician. These may be helpful in lowering uric acid levels.  Maintain a healthy body weight. SEEK MEDICAL CARE IF:   You develop diarrhea, vomiting, or any side effects from medicines.  You do not feel better in 24 hours, or you are getting worse. SEEK IMMEDIATE MEDICAL CARE IF:   Your joint becomes suddenly more tender, and you have chills or a fever. MAKE SURE YOU:   Understand these instructions.  Will watch your condition.  Will get help right away if you are not doing well or get worse. Document Released: 01/21/2000 Document Revised: 05/20/2012 Document Reviewed: 09/06/2011 [facility] Patient Information 2014 [place], [place]. RICE: Routine Care for Injuries The routine care  of many injuries includes Rest, Ice, Compression, and Elevation (RICE). HOME CARE INSTRUCTIONS  Rest is needed to allow your body to heal. Routine activities can usually be resumed when comfortable. Injured tendons and bones can take up to 6 weeks to heal. Tendons are the cord-like structures that attach muscle  to bone.  Ice following an injury helps keep the swelling down and reduces pain.  Put ice in a plastic bag.  Place a towel between your skin and the bag.  Leave the ice on for 15-20 minutes, 03-04 times a day. Do this while awake, for the first 24 to 48 hours. After that, continue as directed by your caregiver.  Compression helps keep swelling down. It also gives support and helps with discomfort. If an elastic bandage has been applied, it should be removed and reapplied every 3 to 4 hours. It should not be applied tightly, but firmly enough to keep swelling down. Watch fingers or toes for swelling, bluish discoloration, coldness, numbness, or excessive pain. If any of these problems occur, remove the bandage and reapply loosely. Contact your caregiver if these problems continue.  Elevation helps reduce swelling and decreases pain. With extremities, such as the arms, hands, legs, and feet, the injured area should be placed near or above the level of the heart, if possible. SEEK IMMEDIATE MEDICAL CARE IF:  You have persistent pain and swelling.  You develop redness, numbness, or unexpected weakness.  Your symptoms are getting worse rather than improving after several days. These symptoms may indicate that further evaluation or further X-rays are needed. Sometimes, X-rays may not show a small broken bone (fracture) until 1 week or 10 days later. Make a follow-up appointment with your caregiver. Ask when your X-ray results will be ready. Make sure you get your X-ray results. Document Released: 05/07/2000 Document Revised: 04/17/2011 Document Reviewed: 06/24/2010 [facility] Patient Information 2014 [place], [place].

## 2013-06-05 NOTE — ED Notes (Signed)
MD at bedside. 

## 2013-06-05 NOTE — ED Notes (Signed)
Care assumed.  Pt states " I have gout and don't need an XR".

## 2014-03-02 ENCOUNTER — Encounter (HOSPITAL_BASED_OUTPATIENT_CLINIC_OR_DEPARTMENT_OTHER): Payer: Self-pay | Admitting: *Deleted

## 2014-03-02 ENCOUNTER — Emergency Department (HOSPITAL_BASED_OUTPATIENT_CLINIC_OR_DEPARTMENT_OTHER)
Admission: EM | Admit: 2014-03-02 | Discharge: 2014-03-02 | Disposition: A | Discharge status: Home / Self Care | Payer: Self-pay | Attending: Emergency Medicine | Admitting: Emergency Medicine

## 2014-03-02 DIAGNOSIS — M10071 Idiopathic gout, right ankle and foot: Secondary | ICD-10-CM | POA: Insufficient documentation

## 2014-03-02 DIAGNOSIS — Z7952 Long term (current) use of systemic steroids: Secondary | ICD-10-CM | POA: Insufficient documentation

## 2014-03-02 DIAGNOSIS — I10 Essential (primary) hypertension: Secondary | ICD-10-CM | POA: Insufficient documentation

## 2014-03-02 DIAGNOSIS — R739 Hyperglycemia, unspecified: Secondary | ICD-10-CM | POA: Insufficient documentation

## 2014-03-02 DIAGNOSIS — Z79899 Other long term (current) drug therapy: Secondary | ICD-10-CM | POA: Insufficient documentation

## 2014-03-02 DIAGNOSIS — Z791 Long term (current) use of non-steroidal anti-inflammatories (NSAID): Secondary | ICD-10-CM | POA: Insufficient documentation

## 2014-03-02 LAB — COMPREHENSIVE METABOLIC PANEL
ALBUMIN: 4 g/dL (ref 3.5–5.2)
ALK PHOS: 73 U/L (ref 39–117)
ALT: 17 U/L (ref 0–53)
AST: 19 U/L (ref 0–37)
Anion gap: 5 (ref 5–15)
BUN: 16 mg/dL (ref 6–23)
CALCIUM: 9.3 mg/dL (ref 8.4–10.5)
CHLORIDE: 103 mmol/L (ref 96–112)
CO2: 24 mmol/L (ref 19–32)
CREATININE: 1.03 mg/dL (ref 0.50–1.35)
GFR calc Af Amer: >90 mL/min (ref >90)
GFR calc non Af Amer: 82 mL/min — ABNORMAL LOW (ref >90)
Glucose, Bld: 121 mg/dL — ABNORMAL HIGH (ref 70–99)
POTASSIUM: 3.6 mmol/L (ref 3.5–5.1)
SODIUM: 132 mmol/L — AB (ref 135–145)
Total Bilirubin: 0.5 mg/dL (ref 0.3–1.2)
Total Protein: 8.4 g/dL — ABNORMAL HIGH (ref 6.0–8.3)

## 2014-03-02 LAB — CBC WITH DIFFERENTIAL/PLATELET
BASOS PCT: 0 % (ref 0–1)
Basophils Absolute: 0 10*3/uL (ref 0.0–0.1)
Eosinophils Absolute: 0.1 10*3/uL (ref 0.0–0.7)
Eosinophils Relative: 1 % (ref 0–5)
HEMATOCRIT: 42.2 % (ref 39.0–52.0)
Hemoglobin: 14.1 g/dL (ref 13.0–17.0)
Lymphocytes Relative: 21 % (ref 12–46)
Lymphs Abs: 1.9 10*3/uL (ref 0.7–4.0)
MCH: 32 pg (ref 26.0–34.0)
MCHC: 33.4 g/dL (ref 30.0–36.0)
MCV: 95.7 fL (ref 78.0–100.0)
MONOS PCT: 10 % (ref 3–12)
Monocytes Absolute: 0.9 10*3/uL (ref 0.1–1.0)
Neutro Abs: 6.2 10*3/uL (ref 1.7–7.7)
Neutrophils Relative %: 68 % (ref 43–77)
PLATELETS: 482 10*3/uL — AB (ref 150–400)
RBC: 4.41 MIL/uL (ref 4.22–5.81)
RDW: 12.2 % (ref 11.5–15.5)
WBC: 9.1 10*3/uL (ref 4.0–10.5)

## 2014-03-02 MED ORDER — PREDNISONE 10 MG PO TABS: ORAL_TABLET | ORAL | Status: DC

## 2014-03-02 MED ORDER — HYDROCODONE-ACETAMINOPHEN 5-325 MG PO TABS
2.0000 | ORAL_TABLET | Freq: Once | ORAL | Status: AC
  Administered 2014-03-02: 2 via ORAL
  Filled 2014-03-02: qty 2

## 2014-03-02 MED ORDER — HYDROCHLOROTHIAZIDE 25 MG PO TABS: 25.0000 mg | ORAL_TABLET | Freq: Every day | ORAL | Status: DC

## 2014-03-02 MED ORDER — HYDROCODONE-ACETAMINOPHEN 5-325 MG PO TABS: 2.0000 | ORAL_TABLET | ORAL | Status: DC | PRN

## 2014-03-02 MED ORDER — PREDNISONE 50 MG PO TABS
60.0000 mg | ORAL_TABLET | Freq: Once | ORAL | Status: AC
  Administered 2014-03-02: 60 mg via ORAL
  Filled 2014-03-02 (×2): qty 1

## 2014-03-02 NOTE — ED Provider Notes (Signed)
CSN: [id number]     Arrival date & time 03/02/14  1428 History  This chart was scribed for Lawrence Canalavid H Lessa Huge, MD by Ronney LionSuzanne Le, ED Scribe. This patient was seen in room MH06/MH06 and the patient's care was started at 3:10 PM.    Chief Complaint  Patient presents with  . Foot Pain   The history is provided by the patient. No language interpreter was used.    HPI Comments: Lawrence [name] is a 49 y.o. male with a history of gout who presents to the Emergency Department complaining of constant right foot pain, from his ankle to his big toe, ongoing since 4 days ago after leaving work. He denies any recent falls or injury. He was last treated about 7 months ago for a gout flareup and states he is sure this is a gout flareup. He has been treated by Prednisone for this in the past, which reduced the swelling. He is currently on allopurinol and indomethacin. Patient denies a personal history of DM and hypertension, although both seem to run in his family. He denies fever, abdominal pain, and chest pain.  Past Medical History  Diagnosis Date  . Gout   . Knee effusion, left    Past Surgical History  Procedure Laterality Date  . Anterior cruciate ligament repair     History reviewed. No pertinent family history. History  Substance Use Topics  . Smoking status: Never Smoker   . Smokeless tobacco: Never Used  . Alcohol Use: 1.8 oz/week    3 Cans of beer per week     Comment: daily    Review of Systems  Constitutional: Negative for fever.  Cardiovascular: Negative for chest pain.  Gastrointestinal: Negative for abdominal pain.  Musculoskeletal: Positive for joint swelling and arthralgias.  All other systems reviewed and are negative.   Allergies  Review of patient's allergies indicates no known allergies.  Home Medications   Prior to Admission medications   Medication Sig Start Date End Date Taking? Authorizing Provider  allopurinol (ZYLOPRIM) 100 MG tablet Take 1 tablet (100 mg total) by  mouth daily. 06/05/13   Kristen N Ward, DO  Diphenhydramine-Acetaminophen (TYLENOL COLD RELIEF PO) Take 5 mLs by mouth every 4 (four) hours as needed. For cold    Historical Provider, MD  HYDROcodone-acetaminophen (NORCO/VICODIN) 5-325 MG per tablet Take 2 tablets by mouth every 4 (four) hours as needed. 05/23/13   Elson AreasLeslie K Sofia, PA-C  HYDROcodone-acetaminophen (NORCO/VICODIN) 5-325 MG per tablet Take 1 tablet by mouth every 4 (four) hours as needed. 06/05/13   Kristen N Ward, DO  indomethacin (INDOCIN) 25 MG capsule Take 1 capsule (25 mg total) by mouth 3 (three) times daily as needed. 06/05/13   Kristen N Ward, DO  predniSONE (DELTASONE) 10 MG tablet 6,5,4,3,2,1 taper 05/23/13   Elson AreasLeslie K Sofia, PA-C  predniSONE (DELTASONE) 20 MG tablet Take 3 tablets (60 mg total) by mouth daily. 06/05/13   Kristen N Ward, DO   BP 162/98 mmHg  Pulse 88  Resp 18  SpO2 100% Physical Exam  Constitutional: He is oriented to person, place, and time. He appears well-developed and well-nourished. No distress.  HENT:  Head: Normocephalic and atraumatic.  Eyes: Conjunctivae and EOM are normal.  Neck: Neck supple. No tracheal deviation present.  Cardiovascular: Normal rate.   2+ pedal pulses.  Pulmonary/Chest: Effort normal. No respiratory distress.  Musculoskeletal: Normal range of motion. He exhibits edema and tenderness.  Right lateral malleolus and right big toe are swollen and tender.  No erythema.   Neurological: He is alert and oriented to person, place, and time.  Skin: Skin is warm and dry.  Psychiatric: He has a normal mood and affect. His behavior is normal.  Nursing note and vitals reviewed.   ED Course  Procedures (including critical care time)  DIAGNOSTIC STUDIES: Oxygen Saturation is 100% on room air, normal by my interpretation.    COORDINATION OF CARE: 3:19 PM - Discussed treatment plan with pt at bedside which includes Prednisone, Norco, and blood tests, and pt agreed to plan.   Labs  Review Labs Reviewed  CBC WITH DIFFERENTIAL/PLATELET - Abnormal; Notable for the following:    Platelets 482 (*)    All other components within normal limits  COMPREHENSIVE METABOLIC PANEL - Abnormal; Notable for the following:    Sodium 132 (*)    Glucose, Bld 121 (*)    Total Protein 8.4 (*)    GFR calc non Af Amer 82 (*)    All other components within normal limits    MDM   Final diagnoses:  None   Lawrence [name] is a 49 y.o. male here with R foot and ankle pain. Exam typical for gout. No obvious cellulitis or infection. Blood work previously showed glucose 120. Repeat blood work here showed glucose 121, nl Cr. I think he might have pre diabetes. Also hypertensive. Will d/c home with prednisone, norco, HCTZ. Will have him f/u with PMD to workup for diabetes and monitor BP.    I personally performed the services described in this documentation, which was scribed in my presence. The recorded information has been reviewed and is accurate.    Lawrence Canal, MD 03/02/14 [phone]

## 2014-03-02 NOTE — Discharge Instructions (Signed)
Take prednisone as prescribed.   Take norco for pain. Do NOT drive with it.   Continue motrin or indomethacin.   Take HCTZ as prescribed.   Your blood pressure and sugar is slightly elevated. You need to see your doctor to get worked up for prediabetes and monitor blood pressure.   Return to ER if you have severe pain, fever, unable to walk, worse swelling.

## 2014-03-02 NOTE — ED Notes (Signed)
Pt amb to room 6 using crutches, unable to wb on right foot. Pt states "I'm having my gout again!" pt reports pain and swelling to right ankle, foot and great toe x last Thursday, pt states he has been taking his allopurinol and indomethecin for a week with no relief. Right ankle is swollen, warm to touch and very tender. Pt rates his pain at 9/10, smiling and laughing with this rn in nad.

## 2014-03-07 ENCOUNTER — Emergency Department (HOSPITAL_BASED_OUTPATIENT_CLINIC_OR_DEPARTMENT_OTHER)
Admission: EM | Admit: 2014-03-07 | Discharge: 2014-03-07 | Disposition: A | Discharge status: Home / Self Care | Payer: Self-pay | Attending: Emergency Medicine | Admitting: Emergency Medicine

## 2014-03-07 ENCOUNTER — Telehealth (HOSPITAL_BASED_OUTPATIENT_CLINIC_OR_DEPARTMENT_OTHER): Payer: Self-pay

## 2014-03-07 ENCOUNTER — Encounter (HOSPITAL_BASED_OUTPATIENT_CLINIC_OR_DEPARTMENT_OTHER): Payer: Self-pay | Admitting: *Deleted

## 2014-03-07 DIAGNOSIS — Z79899 Other long term (current) drug therapy: Secondary | ICD-10-CM | POA: Insufficient documentation

## 2014-03-07 DIAGNOSIS — Z791 Long term (current) use of non-steroidal anti-inflammatories (NSAID): Secondary | ICD-10-CM | POA: Insufficient documentation

## 2014-03-07 DIAGNOSIS — R220 Localized swelling, mass and lump, head: Secondary | ICD-10-CM | POA: Insufficient documentation

## 2014-03-07 DIAGNOSIS — M109 Gout, unspecified: Secondary | ICD-10-CM | POA: Insufficient documentation

## 2014-03-07 DIAGNOSIS — Y998 Other external cause status: Secondary | ICD-10-CM | POA: Insufficient documentation

## 2014-03-07 DIAGNOSIS — T7840XA Allergy, unspecified, initial encounter: Secondary | ICD-10-CM

## 2014-03-07 DIAGNOSIS — Y9389 Activity, other specified: Secondary | ICD-10-CM | POA: Insufficient documentation

## 2014-03-07 DIAGNOSIS — T502X5A Adverse effect of carbonic-anhydrase inhibitors, benzothiadiazides and other diuretics, initial encounter: Secondary | ICD-10-CM | POA: Insufficient documentation

## 2014-03-07 DIAGNOSIS — Y9289 Other specified places as the place of occurrence of the external cause: Secondary | ICD-10-CM | POA: Insufficient documentation

## 2014-03-07 MED ORDER — DIPHENHYDRAMINE HCL 25 MG PO TABS: 25.0000 mg | ORAL_TABLET | Freq: Four times a day (QID) | ORAL | Status: DC | PRN

## 2014-03-07 MED ORDER — DIPHENHYDRAMINE HCL 25 MG PO CAPS
25.0000 mg | ORAL_CAPSULE | Freq: Once | ORAL | Status: AC
  Administered 2014-03-07: 25 mg via ORAL
  Filled 2014-03-07: qty 1

## 2014-03-07 NOTE — ED Provider Notes (Signed)
CSN: [id number]     Arrival date & time 03/07/14  1514 History  This chart was scribed for Candyce ChurnJohn David Karuna Balducci III, * by Evon Slackerrance Branch, ED Scribe. This patient was seen in room MH10/MH10 and the patient's care was started at 3:27 PM.      Chief Complaint  Patient presents with  . Facial Swelling    The history is provided by the patient. No language interpreter was used.   HPI Comments: Launa GrillDonavau Cathlean CowerBaldwin is a 49 y.o. male who presents to the Emergency Department complaining of facial swelling onset today around 12PM. Pt states that he notices the facial swelling around his eyes. Pt states that about 5 days ago he was recently seen in the ED for gout in his right foot. Pt states that he was prescribed prednisone, Vicodin, and hydrochlorothiazide and thinks he may be allergic to one of these medications. Pt states that he has taking prednisone and Vicodin in the past without any complications. Pt also states that he 3 days ago he recently had his hair done and had dye applied to his hair which he may also be allergic too. Pt states these are the only changes in in his life in the past week. Denies any medications PTA. Denies trouble swallowing, SOB, wheezing, abdominal pain, nausea, light headedness, syncope, rash or hives.    Past Medical History  Diagnosis Date  . Gout   . Knee effusion, left    Past Surgical History  Procedure Laterality Date  . Anterior cruciate ligament repair     No family history on file. History  Substance Use Topics  . Smoking status: Never Smoker   . Smokeless tobacco: Never Used  . Alcohol Use: 1.8 oz/week    3 Cans of beer per week     Comment: daily    Review of Systems  HENT: Positive for facial swelling. Negative for trouble swallowing.   Respiratory: Negative for shortness of breath and wheezing.   Gastrointestinal: Negative for nausea and abdominal pain.  Skin: Negative for rash.  Neurological: Negative for syncope and light-headedness.  All  other systems reviewed and are negative.     Allergies  Review of patient's allergies indicates no known allergies.  Home Medications   Prior to Admission medications   Medication Sig Start Date End Date Taking? Authorizing Provider  allopurinol (ZYLOPRIM) 100 MG tablet Take 1 tablet (100 mg total) by mouth daily. 06/05/13  Yes Kristen N Ward, DO  hydrochlorothiazide (HYDRODIURIL) 25 MG tablet Take 1 tablet (25 mg total) by mouth daily. 03/02/14  Yes Richardean Canalavid H Yao, MD  HYDROcodone-acetaminophen (NORCO/VICODIN) 5-325 MG per tablet Take 2 tablets by mouth every 4 (four) hours as needed. 03/02/14  Yes Richardean Canalavid H Yao, MD  indomethacin (INDOCIN) 25 MG capsule Take 1 capsule (25 mg total) by mouth 3 (three) times daily as needed. 06/05/13  Yes Kristen N Ward, DO  predniSONE (DELTASONE) 10 MG tablet 6,5,4,3,2,1 taper 03/02/14  Yes Richardean Canalavid H Yao, MD  Diphenhydramine-Acetaminophen (TYLENOL COLD RELIEF PO) Take 5 mLs by mouth every 4 (four) hours as needed. For cold    Historical Provider, MD   BP 125/72 mmHg  Pulse 102  Temp(Src) 98.3 F (36.8 C) (Oral)  Resp 20  SpO2 98%   Physical Exam  Constitutional: He is oriented to person, place, and time. He appears well-developed and well-nourished. No distress.  HENT:  Head: Normocephalic and atraumatic.  Mild periorbital angioedema, bilateral, worse on left.  No tenderness.  Oral or  pharyngeal swelling.    Eyes: Conjunctivae are normal. No scleral icterus.  Neck: Neck supple.  Cardiovascular: Normal rate and intact distal pulses.   Pulmonary/Chest: Effort normal. No stridor. No respiratory distress. He has no wheezes.  Abdominal: Normal appearance. He exhibits no distension.  Neurological: He is alert and oriented to person, place, and time.  Skin: Skin is warm and dry. No rash noted.  Psychiatric: He has a normal mood and affect. His behavior is normal.  Nursing note and vitals reviewed.   ED Course  Procedures (including critical care  time) DIAGNOSTIC STUDIES: Oxygen Saturation is 98% on RA, normal by my interpretation.    COORDINATION OF CARE: 3:36 PM-Discussed treatment plan with pt at bedside and pt agreed to plan.     Labs Review Labs Reviewed - No data to display  Imaging Review No results found.   EKG Interpretation None      MDM   Final diagnoses:  Allergic reaction, initial encounter      49yo male with signs of mild allergic reaction.  Just started HCTZ this week.  Also had his hair colored this week.  Has been taking prednisone and hydrocodone for gout, but has tolerated these medications in the past without side effect.  Plan benadryl, stop HCTZ, follow up with PCP.  Return precautions given.     I personally performed the services described in this documentation, which was scribed in my presence. The recorded information has been reviewed and is accurate.      Candyce Churn III, MD 03/07/14 [phone]

## 2014-03-07 NOTE — ED Notes (Signed)
See here on 1/25 and dx with gout- taking prednisone, hctz and hydrocodone as directed, reports facial swelling since last night- also had hair colored on Wed

## 2014-03-07 NOTE — ED Notes (Signed)
Attempted to return phone call and no answer at number provided. [facility]

## 2014-03-16 ENCOUNTER — Emergency Department (HOSPITAL_BASED_OUTPATIENT_CLINIC_OR_DEPARTMENT_OTHER): Payer: BLUE CROSS/BLUE SHIELD

## 2014-03-16 ENCOUNTER — Emergency Department (HOSPITAL_BASED_OUTPATIENT_CLINIC_OR_DEPARTMENT_OTHER)
Admission: EM | Admit: 2014-03-16 | Discharge: 2014-03-16 | Disposition: A | Discharge status: Home / Self Care | Payer: BLUE CROSS/BLUE SHIELD | Attending: Emergency Medicine | Admitting: Emergency Medicine

## 2014-03-16 ENCOUNTER — Encounter (HOSPITAL_BASED_OUTPATIENT_CLINIC_OR_DEPARTMENT_OTHER): Payer: Self-pay | Admitting: *Deleted

## 2014-03-16 ENCOUNTER — Encounter: Payer: Self-pay | Admitting: Family Medicine

## 2014-03-16 ENCOUNTER — Ambulatory Visit (INDEPENDENT_AMBULATORY_CARE_PROVIDER_SITE_OTHER): Payer: Self-pay | Admitting: Family Medicine

## 2014-03-16 VITALS — BP 173/111 | HR 97 | Ht 69.0 in | Wt 195.0 lb

## 2014-03-16 DIAGNOSIS — Z79899 Other long term (current) drug therapy: Secondary | ICD-10-CM | POA: Insufficient documentation

## 2014-03-16 DIAGNOSIS — M25461 Effusion, right knee: Secondary | ICD-10-CM | POA: Insufficient documentation

## 2014-03-16 DIAGNOSIS — M2540 Effusion, unspecified joint: Secondary | ICD-10-CM

## 2014-03-16 DIAGNOSIS — M25561 Pain in right knee: Secondary | ICD-10-CM

## 2014-03-16 DIAGNOSIS — Z7952 Long term (current) use of systemic steroids: Secondary | ICD-10-CM | POA: Insufficient documentation

## 2014-03-16 MED ORDER — HYDROCODONE-ACETAMINOPHEN 5-325 MG PO TABS: 1.0000 | ORAL_TABLET | ORAL | Status: DC | PRN

## 2014-03-16 MED ORDER — METHYLPREDNISOLONE ACETATE 40 MG/ML IJ SUSP
40.0000 mg | Freq: Once | INTRAMUSCULAR | Status: AC
  Administered 2014-03-16: 40 mg via INTRA_ARTICULAR

## 2014-03-16 NOTE — ED Notes (Signed)
Gout flare up x 2 weeks- swelling x 1 day

## 2014-03-16 NOTE — Patient Instructions (Signed)
Your knee pain and swelling are due to gout. We pulled off the fluid (23mL) and injected you with cortisone. This can take a couple days to kick in but should help tremendously. Take the pain medication as prescribed from the ED. Out of work until Thursday. Follow up with me in 2 weeks for reevaluation.

## 2014-03-16 NOTE — Discharge Instructions (Signed)

## 2014-03-16 NOTE — ED Notes (Signed)
F/u appt with Dr Pearletha ForgeHudnall at available at 11:15 which pt accepted. Pt's rx x 1 for hydrocodone taken to pharmacy per his request- Staff assisting pt to find Dr Lazaro ArmsHudnall's office at d/c

## 2014-03-16 NOTE — ED Provider Notes (Signed)
CSN: [id number]     Arrival date & time 03/16/14  1025 History   First MD Initiated Contact with Patient 03/16/14 1026     Chief Complaint  Patient presents with  . Joint Swelling     (Consider location/radiation/quality/duration/timing/severity/associated sxs/prior Treatment) HPI Comments: Pt comes in with c/o right knee swelling times 1 days. Denies fever, redness or warmth. States that he has been fighting gout in his right ankle for a weak and he was treated with endomethacin and prednisone. He states that his ankle is not better but now he has fluid on his knee. He states that he has had previous episodes similar and has had it drained in the past.   The history is provided by the patient. No language interpreter was used.    Past Medical History  Diagnosis Date  . Gout   . Knee effusion, left    Past Surgical History  Procedure Laterality Date  . Anterior cruciate ligament repair     No family history on file. History  Substance Use Topics  . Smoking status: Never Smoker   . Smokeless tobacco: Never Used  . Alcohol Use: 1.8 oz/week    3 Cans of beer per week     Comment: daily    Review of Systems  All other systems reviewed and are negative.     Allergies  Review of patient's allergies indicates no known allergies.  Home Medications   Prior to Admission medications   Medication Sig Start Date End Date Taking? Authorizing Provider  allopurinol (ZYLOPRIM) 100 MG tablet Take 1 tablet (100 mg total) by mouth daily. 06/05/13  Yes Kristen N Ward, DO  diphenhydrAMINE (BENADRYL) 25 MG tablet Take 1 tablet (25 mg total) by mouth every 6 (six) hours as needed for itching (swelling). 03/07/14  Yes Candyce Churn III, MD  hydrochlorothiazide (HYDRODIURIL) 25 MG tablet Take 1 tablet (25 mg total) by mouth daily. 03/02/14  Yes Richardean Canal, MD  indomethacin (INDOCIN) 25 MG capsule Take 1 capsule (25 mg total) by mouth 3 (three) times daily as needed. 06/05/13  Yes Kristen N  Ward, DO  Diphenhydramine-Acetaminophen (TYLENOL COLD RELIEF PO) Take 5 mLs by mouth every 4 (four) hours as needed. For cold    Historical Provider, MD  HYDROcodone-acetaminophen (NORCO/VICODIN) 5-325 MG per tablet Take 2 tablets by mouth every 4 (four) hours as needed. 03/02/14   Richardean Canal, MD  predniSONE (DELTASONE) 10 MG tablet 6,5,4,3,2,1 taper 03/02/14   Richardean Canal, MD   BP 154/91 mmHg  Pulse 97  Temp(Src) 98.6 F (37 C) (Oral)  Resp 18  Ht  (1.753 m)  Wt 195 lb (88.451 kg)  BMI 28.78 kg/m2  SpO2 98% Physical Exam  Constitutional: He is oriented to person, place, and time. He appears well-developed and well-nourished.  Cardiovascular: Normal rate and regular rhythm.   Pulmonary/Chest: Effort normal and breath sounds normal.  Musculoskeletal:  Swelling noted to the right knee. Has full rom. No redness or warmth noted. Has full rom of right ankle  Neurological: He is alert and oriented to person, place, and time.  Skin: Skin is warm and dry.  Nursing note and vitals reviewed.   ED Course  Procedures (including critical care time) Labs Review Labs Reviewed - No data to display  Imaging Review Dg Knee 1-2 Views Right  03/16/2014   CLINICAL DATA:  Acute right knee pain and swelling without known injury. Initial encounter.  EXAM: RIGHT KNEE - 1-2  VIEW  COMPARISON:  Jul 01, 2009.  FINDINGS: Stable postsurgical changes are noted compared to prior exam. Severe degenerative joint disease is noted laterally with osteophyte formation which is increased compared to prior exam Mild degenerative joint disease is noted medially. Moderate narrowing of patellofemoral space is noted. Mild suprapatellar joint effusion is noted. No fracture or dislocation is noted.  IMPRESSION: Moderate to severe tricompartmental degenerative joint disease is noted. No fracture or dislocation is noted. Mild suprapatellar joint effusion is noted.   Electronically Signed   By: Roque LiasJames  Green M.D.   On: 03/16/2014  11:27     EKG Interpretation None      MDM   Final diagnoses:  Knee effusion, right    Pt is going to go upstairs to be evaluated by DR. Tawanna Coolerhundall   Dustan Hyams, NP 03/16/14 1236  Vida RollerBrian D Miller, MD 03/17/14 [phone]

## 2014-03-16 NOTE — ED Notes (Signed)
NP at bedside.

## 2014-03-16 NOTE — ED Provider Notes (Signed)
The patient complains of swelling to the right knee. He's had prior surgery on this knee with reconstruction of the ligament, on exam he has a very swollen right knee with an effusion which is clinically present. X-rays performed, patient is to follow-up with Dr. Esperanza Heirupstairs. He denies fevers chills and there is no redness or warmth over the joint.  Xrays pending - pt to be evaluated immediately by Dr. Pearletha ForgeHudnall upstairs.  Has hx of frquent effusions requiring recurrent drainage  Medical screening examination/treatment/procedure(s) were conducted as a shared visit with non-physician practitioner(s) and myself.  I personally evaluated the patient during the encounter.  Clinical Impression:   Final diagnoses:  Knee effusion, right         Vida RollerBrian D Keimari Leoti, MD 03/17/14 [phone]

## 2014-03-18 ENCOUNTER — Ambulatory Visit: Payer: Self-pay | Admitting: Family Medicine

## 2014-03-18 DIAGNOSIS — M25461 Effusion, right knee: Secondary | ICD-10-CM | POA: Insufficient documentation

## 2014-03-18 MED ORDER — OXYCODONE-ACETAMINOPHEN 7.5-325 MG PO TABS: 1.0000 | ORAL_TABLET | Freq: Four times a day (QID) | ORAL | Status: DC | PRN

## 2014-03-18 NOTE — Assessment & Plan Note (Signed)
consistent with acute gout flare.  Knee aspirated and injected with ultrasound guidance.  Cloudy yellow fluid also consistent with gout.  Continue pain medication prescribed from ED.  Out of work until Thursday.  F/u in 2 weeks.  After informed written consent patient was lying supine on exam table.  Right knee was prepped with alcohol swab.  Utilizing superolateral approach, 3 mL of marcaine was used for local anesthesia.  Then using an 18g needle on 60cc syringe, 23 mL of cloudy straw-colored fluid was aspirated from right knee.  Knee was then injected with 3:1 marcaine:depomedrol.  Patient tolerated procedure well without immediate complications

## 2014-03-18 NOTE — Addendum Note (Signed)
Addended by: Lenda KelpHUDNALL, Quina Wilbourne R on: 03/18/2014 10:06 AM   Modules accepted: Orders, Medications, SmartSet

## 2014-03-18 NOTE — Progress Notes (Signed)
PCP: No primary care provider on file.  Subjective:   HPI: Patient is a 49 y.o. male here for right knee effusion.  Patient has known history of gout. States over past week has had worsening pain, swelling in right knee. He had gout in his foot prior to this - took indomethacin and prednisone. Feels like a gout flare. No catching, locking, giving out. Remotely tore this ACL.  Past Medical History  Diagnosis Date  . Gout   . Knee effusion, left     Current Outpatient Prescriptions on File Prior to Visit  Medication Sig Dispense Refill  . allopurinol (ZYLOPRIM) 100 MG tablet Take 1 tablet (100 mg total) by mouth daily. 30 tablet 0  . diphenhydrAMINE (BENADRYL) 25 MG tablet Take 1 tablet (25 mg total) by mouth every 6 (six) hours as needed for itching (swelling). 10 tablet 0  . Diphenhydramine-Acetaminophen (TYLENOL COLD RELIEF PO) Take 5 mLs by mouth every 4 (four) hours as needed. For cold    . hydrochlorothiazide (HYDRODIURIL) 25 MG tablet Take 1 tablet (25 mg total) by mouth daily. 30 tablet 0  . HYDROcodone-acetaminophen (NORCO/VICODIN) 5-325 MG per tablet Take 1-2 tablets by mouth every 4 (four) hours as needed. 15 tablet 0  . indomethacin (INDOCIN) 25 MG capsule Take 1 capsule (25 mg total) by mouth 3 (three) times daily as needed. 30 capsule 0  . predniSONE (DELTASONE) 10 MG tablet 6,5,4,3,2,1 taper 21 tablet 0   No current facility-administered medications on file prior to visit.    Past Surgical History  Procedure Laterality Date  . Anterior cruciate ligament repair      No Known Allergies  History   Social History  . Marital Status: Married    Spouse Name: N/A  . Number of Children: N/A  . Years of Education: N/A   Occupational History  . Not on file.   Social History Main Topics  . Smoking status: Never Smoker   . Smokeless tobacco: Never Used  . Alcohol Use: 1.8 oz/week    3 Cans of beer per week     Comment: daily  . Drug Use: No  . Sexual  Activity: Yes    Birth Control/ Protection: Condom   Other Topics Concern  . Not on file   Social History Narrative    No family history on file.  BP 173/111 mmHg  Pulse 97  Ht 5\' 9"  (1.753 m)  Wt 195 lb (88.451 kg)  BMI 28.78 kg/m2  Review of Systems: See HPI above.    Objective:  Physical Exam:  Gen: NAD  Right knee: Mod effusion.  No bruising, other deformity.  Warmth but no erythema. Diffuse anterior tenderness. ROM 0 - 90 degrees, slow and painful. Negative ant/post drawers. Negative valgus/varus testing. Negative lachmanns. Negative mcmurrays, apleys, patellar apprehension. NV intact distally.    Assessment & Plan:  1. Right knee effusion/pain - consistent with acute gout flare.  Knee aspirated and injected with ultrasound guidance.  Cloudy yellow fluid also consistent with gout.  Continue pain medication prescribed from ED.  Out of work until Thursday.  F/u in 2 weeks.  After informed written consent patient was lying supine on exam table.  Right knee was prepped with alcohol swab.  Utilizing superolateral approach, 3 mL of marcaine was used for local anesthesia.  Then using an 18g needle on 60cc syringe, 23 mL of cloudy straw-colored fluid was aspirated from right knee.  Knee was then injected with 3:1 marcaine:depomedrol.  Patient tolerated procedure  well without immediate complications

## 2014-03-24 ENCOUNTER — Encounter: Payer: Self-pay | Admitting: Family Medicine

## 2014-03-24 ENCOUNTER — Telehealth: Payer: Self-pay | Admitting: *Deleted

## 2014-03-24 ENCOUNTER — Ambulatory Visit (INDEPENDENT_AMBULATORY_CARE_PROVIDER_SITE_OTHER): Payer: Self-pay | Admitting: Family Medicine

## 2014-03-24 VITALS — BP 140/89 | HR 86 | Ht 69.0 in | Wt 195.0 lb

## 2014-03-24 DIAGNOSIS — M25461 Effusion, right knee: Secondary | ICD-10-CM

## 2014-03-24 DIAGNOSIS — M25561 Pain in right knee: Secondary | ICD-10-CM

## 2014-03-24 MED ORDER — METHYLPREDNISOLONE ACETATE 40 MG/ML IJ SUSP
40.0000 mg | Freq: Once | INTRAMUSCULAR | Status: AC
  Administered 2014-03-24: 40 mg via INTRA_ARTICULAR

## 2014-03-24 NOTE — Patient Instructions (Signed)
We repeated your injection today. Call me next Monday to let me know how you're doing. If not improving still after this I would recommend going ahead with further imaging (MRI).

## 2014-03-24 NOTE — Telephone Encounter (Signed)
Mom called and stated that the patient is still unable to bear weight on leg. Stated that the cortisone injection has helped with the pain some. Wants to know if he should come in to the office. Had injection on 03-16-14 and that it still may be too early for another.

## 2014-03-24 NOTE — Telephone Encounter (Signed)
We said when he came in last week that if he was still having problems he should make an appointment early this week.  At that time we could consider repeating the aspiration and injection but we needed to give it a week to see if it was going to work.

## 2014-03-24 NOTE — Telephone Encounter (Signed)
Spoke with patient and scheduled appointment to be seen on 03-24-14 about his right knee.

## 2014-03-25 NOTE — Progress Notes (Signed)
PCP: No primary care provider on file.  Subjective:   HPI: Patient is a 49 y.o. male here for right knee effusion.  2/8: Patient has known history of gout. States over past week has had worsening pain, swelling in right knee. He had gout in his foot prior to this - took indomethacin and prednisone. Feels like a gout flare. No catching, locking, giving out. Remotely tore this ACL.  2/16: Patient reports he has improved since last week but pain still 6/10 and difficulty bearing weight. Swelling has improved but some still present.  Past Medical History  Diagnosis Date  . Gout   . Knee effusion, left     Current Outpatient Prescriptions on File Prior to Visit  Medication Sig Dispense Refill  . allopurinol (ZYLOPRIM) 100 MG tablet Take 1 tablet (100 mg total) by mouth daily. 30 tablet 0  . diphenhydrAMINE (BENADRYL) 25 MG tablet Take 1 tablet (25 mg total) by mouth every 6 (six) hours as needed for itching (swelling). 10 tablet 0  . Diphenhydramine-Acetaminophen (TYLENOL COLD RELIEF PO) Take 5 mLs by mouth every 4 (four) hours as needed. For cold    . hydrochlorothiazide (HYDRODIURIL) 25 MG tablet Take 1 tablet (25 mg total) by mouth daily. 30 tablet 0  . indomethacin (INDOCIN) 25 MG capsule Take 1 capsule (25 mg total) by mouth 3 (three) times daily as needed. 30 capsule 0  . oxyCODONE-acetaminophen (PERCOCET) 7.5-325 MG per tablet Take 1 tablet by mouth every 6 (six) hours as needed for pain. 40 tablet 0  . predniSONE (DELTASONE) 10 MG tablet 6,5,4,3,2,1 taper 21 tablet 0   No current facility-administered medications on file prior to visit.    Past Surgical History  Procedure Laterality Date  . Anterior cruciate ligament repair      No Known Allergies  History   Social History  . Marital Status: Married    Spouse Name: N/A  . Number of Children: N/A  . Years of Education: N/A   Occupational History  . Not on file.   Social History Main Topics  . Smoking status:  Never Smoker   . Smokeless tobacco: Never Used  . Alcohol Use: 1.8 oz/week    3 Cans of beer per week     Comment: daily  . Drug Use: No  . Sexual Activity: Yes    Birth Control/ Protection: Condom   Other Topics Concern  . Not on file   Social History Narrative    No family history on file.  BP 140/89 mmHg  Pulse 86  Ht  (1.753 m)  Wt 195 lb (88.451 kg)  BMI 28.78 kg/m2  Review of Systems: See HPI above.    Objective:  Physical Exam:  Gen: NAD  Right knee: Minimal effusion.  No bruising, other deformity.  Warmth but no erythema. Diffuse anterior tenderness. ROM 0 - 90 degrees, slow and painful. Negative ant/post drawers. Negative valgus/varus testing. Negative lachmanns. Negative mcmurrays, apleys, patellar apprehension. NV intact distally.    Assessment & Plan:  1. Right knee effusion/pain - consistent with acute gout flare.  Swelling much better and some improvement in pain - discussed options and went ahead with repeat ultrasound guided injection.  If still not improving would consider further imaging, repeat prednisone dose pack.  Work note provided.  After informed written consent patient was lying supine on exam table.  Right knee was prepped with alcohol swab.  Utilizing superolateral approach under ultrasound guidance, right knee was injected with 3:1 marcaine:depomedrol.  Patient tolerated procedure well without immediate complications

## 2014-03-25 NOTE — Assessment & Plan Note (Signed)
consistent with acute gout flare.  Swelling much better and some improvement in pain - discussed options and went ahead with repeat ultrasound guided injection.  If still not improving would consider further imaging, repeat prednisone dose pack.  Work note provided.  After informed written consent patient was lying supine on exam table.  Right knee was prepped with alcohol swab.  Utilizing superolateral approach under ultrasound guidance, right knee was injected with 3:1 marcaine:depomedrol.  Patient tolerated procedure well without immediate complications

## 2014-04-01 ENCOUNTER — Ambulatory Visit: Payer: BLUE CROSS/BLUE SHIELD | Admitting: Family Medicine

## 2014-04-03 ENCOUNTER — Encounter: Payer: Self-pay | Admitting: Family Medicine

## 2014-04-03 ENCOUNTER — Telehealth: Payer: Self-pay | Admitting: Family Medicine

## 2014-04-03 NOTE — Telephone Encounter (Signed)
Letter faxed.

## 2014-04-03 NOTE — Telephone Encounter (Signed)
Letter written and printed.

## 2014-04-12 ENCOUNTER — Encounter (HOSPITAL_BASED_OUTPATIENT_CLINIC_OR_DEPARTMENT_OTHER): Payer: Self-pay

## 2014-04-12 ENCOUNTER — Emergency Department (HOSPITAL_BASED_OUTPATIENT_CLINIC_OR_DEPARTMENT_OTHER)
Admission: EM | Admit: 2014-04-12 | Discharge: 2014-04-12 | Disposition: A | Discharge status: Home / Self Care | Payer: Self-pay | Attending: Emergency Medicine | Admitting: Emergency Medicine

## 2014-04-12 DIAGNOSIS — Z79899 Other long term (current) drug therapy: Secondary | ICD-10-CM | POA: Insufficient documentation

## 2014-04-12 DIAGNOSIS — M109 Gout, unspecified: Secondary | ICD-10-CM | POA: Insufficient documentation

## 2014-04-12 DIAGNOSIS — L02213 Cutaneous abscess of chest wall: Secondary | ICD-10-CM | POA: Insufficient documentation

## 2014-04-12 MED ORDER — CEPHALEXIN 500 MG PO CAPS: 500.0000 mg | ORAL_CAPSULE | Freq: Four times a day (QID) | ORAL | Status: DC

## 2014-04-12 MED ORDER — SULFAMETHOXAZOLE-TRIMETHOPRIM 800-160 MG PO TABS: 1.0000 | ORAL_TABLET | Freq: Two times a day (BID) | ORAL | Status: AC

## 2014-04-12 NOTE — Discharge Instructions (Signed)
Keflex and Bactrim as prescribed.  Warm soaks several times daily.  Return to the emergency department if your symptoms significantly worsen or change.   Abscess An abscess is an infected area that contains a collection of pus and debris.It can occur in almost any part of the body. An abscess is also known as a furuncle or boil. CAUSES  An abscess occurs when tissue gets infected. This can occur from blockage of oil or sweat glands, infection of hair follicles, or a minor injury to the skin. As the body tries to fight the infection, pus collects in the area and creates pressure under the skin. This pressure causes pain. People with weakened immune systems have difficulty fighting infections and get certain abscesses more often.  SYMPTOMS Usually an abscess develops on the skin and becomes a painful mass that is red, warm, and tender. If the abscess forms under the skin, you may feel a moveable soft area under the skin. Some abscesses break open (rupture) on their own, but most will continue to get worse without care. The infection can spread deeper into the body and eventually into the bloodstream, causing you to feel ill.  DIAGNOSIS  Your caregiver will take your medical history and perform a physical exam. A sample of fluid may also be taken from the abscess to determine what is causing your infection. TREATMENT  Your caregiver may prescribe antibiotic medicines to fight the infection. However, taking antibiotics alone usually does not cure an abscess. Your caregiver may need to make a small cut (incision) in the abscess to drain the pus. In some cases, gauze is packed into the abscess to reduce pain and to continue draining the area. HOME CARE INSTRUCTIONS   Only take over-the-counter or prescription medicines for pain, discomfort, or fever as directed by your caregiver.  If you were prescribed antibiotics, take them as directed. Finish them even if you start to feel better.  If gauze is  used, follow your caregiver's directions for changing the gauze.  To avoid spreading the infection:  Keep your draining abscess covered with a bandage.  Wash your hands well.  Do not share personal care items, towels, or whirlpools with others.  Avoid skin contact with others.  Keep your skin and clothes clean around the abscess.  Keep all follow-up appointments as directed by your caregiver. SEEK MEDICAL CARE IF:   You have increased pain, swelling, redness, fluid drainage, or bleeding.  You have muscle aches, chills, or a general ill feeling.  You have a fever. MAKE SURE YOU:   Understand these instructions.  Will watch your condition.  Will get help right away if you are not doing well or get worse. Document Released: 11/02/2004 Document Revised: 07/25/2011 Document Reviewed: 04/07/2011 [facility] Patient Information 2015 [place], [place]. This information is not intended to replace advice given to you by your health care provider. Make sure you discuss any questions you have with your health care provider.

## 2014-04-12 NOTE — ED Provider Notes (Signed)
CSN: [id number]     Arrival date & time 04/12/14  1057 History   First MD Initiated Contact with Patient 04/12/14 1139     Chief Complaint  Patient presents with  . Abscess     (Consider location/radiation/quality/duration/timing/severity/associated sxs/prior Treatment) HPI Comments: Patient is a 49 year old male who presents with complaints of a sore to his anterior chest wall that has been present for 2 months. He states that it intermittently drains then dries up.  It seems to improve for a short period of time, then begins to drain again. Has not seen a doctor for this.  Patient is a 49 y.o. male presenting with abscess. The history is provided by the patient.  Abscess Abscess location: Chest. Abscess quality: draining, painful and redness   Red streaking: no   Duration:  2 months Progression:  Worsening Pain details:    Severity:  Mild   Timing:  Constant   Progression:  Worsening   Past Medical History  Diagnosis Date  . Gout   . Knee effusion, left    Past Surgical History  Procedure Laterality Date  . Anterior cruciate ligament repair     No family history on file. History  Substance Use Topics  . Smoking status: Never Smoker   . Smokeless tobacco: Never Used  . Alcohol Use: 1.8 oz/week    3 Cans of beer per week     Comment: daily    Review of Systems  All other systems reviewed and are negative.     Allergies  Review of patient's allergies indicates no known allergies.  Home Medications   Prior to Admission medications   Medication Sig Start Date End Date Taking? Authorizing Provider  allopurinol (ZYLOPRIM) 100 MG tablet Take 1 tablet (100 mg total) by mouth daily. 06/05/13   Kristen N Ward, DO  diphenhydrAMINE (BENADRYL) 25 MG tablet Take 1 tablet (25 mg total) by mouth every 6 (six) hours as needed for itching (swelling). 03/07/14   Candyce Churn III, MD  Diphenhydramine-Acetaminophen (TYLENOL COLD RELIEF PO) Take 5 mLs by mouth every 4 (four)  hours as needed. For cold    Historical Provider, MD  hydrochlorothiazide (HYDRODIURIL) 25 MG tablet Take 1 tablet (25 mg total) by mouth daily. 03/02/14   Richardean Canal, MD  indomethacin (INDOCIN) 25 MG capsule Take 1 capsule (25 mg total) by mouth 3 (three) times daily as needed. 06/05/13   Kristen N Ward, DO  oxyCODONE-acetaminophen (PERCOCET) 7.5-325 MG per tablet Take 1 tablet by mouth every 6 (six) hours as needed for pain. 03/18/14   Lenda Kelp, MD  predniSONE (DELTASONE) 10 MG tablet 6,5,4,3,2,1 taper 03/02/14   Richardean Canal, MD   BP 173/107 mmHg  Pulse 84  Temp(Src) 98.3 F (36.8 C) (Oral)  Resp 18  Ht  (1.753 m)  Wt 195 lb (88.451 kg)  BMI 28.78 kg/m2  SpO2 98% Physical Exam  Constitutional: He is oriented to person, place, and time. He appears well-developed and well-nourished. No distress.  HENT:  Head: Normocephalic and atraumatic.  Neck: Normal range of motion. Neck supple.  Neurological: He is alert and oriented to person, place, and time.  Skin: Skin is warm and dry. He is not diaphoretic.  There is a sore to the center of the upper chest that is noted to be draining a dark brown, purulent material. There is mild surrounding erythema. There is no fluctuance.  Nursing note and vitals reviewed.   ED Course  Procedures (  including critical care time) Labs Review Labs Reviewed - No data to display  Imaging Review No results found.   EKG Interpretation None      MDM   Final diagnoses:  None    There is an abscess to the chest wall which is draining. This will be treated with Keflex and Bactrim and local wound care. He is to return as needed if his symptoms worsen.    Geoffery Lyonsouglas Kaniesha Barile, MD 04/12/14 1159

## 2014-04-12 NOTE — ED Notes (Signed)
Patient here with abscess to chest for a few months, reports pain and drainage with same

## 2014-05-23 ENCOUNTER — Encounter (HOSPITAL_BASED_OUTPATIENT_CLINIC_OR_DEPARTMENT_OTHER): Payer: Self-pay | Admitting: Emergency Medicine

## 2014-05-23 ENCOUNTER — Emergency Department (HOSPITAL_BASED_OUTPATIENT_CLINIC_OR_DEPARTMENT_OTHER)
Admission: EM | Admit: 2014-05-23 | Discharge: 2014-05-23 | Disposition: A | Discharge status: Home / Self Care | Payer: Self-pay | Attending: Emergency Medicine | Admitting: Emergency Medicine

## 2014-05-23 DIAGNOSIS — M25461 Effusion, right knee: Secondary | ICD-10-CM | POA: Insufficient documentation

## 2014-05-23 DIAGNOSIS — Z791 Long term (current) use of non-steroidal anti-inflammatories (NSAID): Secondary | ICD-10-CM | POA: Insufficient documentation

## 2014-05-23 DIAGNOSIS — M109 Gout, unspecified: Secondary | ICD-10-CM | POA: Insufficient documentation

## 2014-05-23 DIAGNOSIS — Z79899 Other long term (current) drug therapy: Secondary | ICD-10-CM | POA: Insufficient documentation

## 2014-05-23 MED ORDER — LIDOCAINE HCL (PF) 1 % IJ SOLN
INTRAMUSCULAR | Status: AC
  Filled 2014-05-23: qty 5

## 2014-05-23 MED ORDER — HYDROCODONE-ACETAMINOPHEN 5-325 MG PO TABS
2.0000 | ORAL_TABLET | Freq: Once | ORAL | Status: AC
  Administered 2014-05-23: 2 via ORAL
  Filled 2014-05-23: qty 2

## 2014-05-23 MED ORDER — HYDROCODONE-ACETAMINOPHEN 5-325 MG PO TABS: 2.0000 | ORAL_TABLET | ORAL | Status: DC | PRN

## 2014-05-23 MED ORDER — LIDOCAINE HCL (PF) 1 % IJ SOLN
5.0000 mL | Freq: Once | INTRAMUSCULAR | Status: AC
  Administered 2014-05-23: 5 mL via INTRADERMAL
  Filled 2014-05-23: qty 5

## 2014-05-23 NOTE — Discharge Instructions (Signed)
Take Vicodin as needed for pain. Follow up with Dr. Pearletha ForgeHudnall for further evaluation. Refer to attached documents for more information.

## 2014-05-23 NOTE — ED Notes (Signed)
Time Out performed prior to PA-C draining fluid off Rt knee, verbal permission obtained from pt, by both PA and this RN

## 2014-05-23 NOTE — ED Provider Notes (Signed)
CSN: [id number]     Arrival date & time 05/23/14  1141 History   First MD Initiated Contact with Patient 05/23/14 1204     Chief Complaint  Patient presents with  . Knee Pain     (Consider location/radiation/quality/duration/timing/severity/associated sxs/prior Treatment) Patient is a 49 y.o. male presenting with knee pain. The history is provided by the patient. No language interpreter was used.  Knee Pain Location:  Knee Time since incident:  1 day Injury: no   Knee location:  R knee Pain details:    Quality:  Aching   Radiates to:  Does not radiate   Severity:  Severe   Onset quality:  Gradual   Duration:  1 day   Timing:  Constant   Progression:  Unchanged Chronicity:  Recurrent Dislocation: no   Foreign body present:  No foreign bodies Tetanus status:  Unknown Prior injury to area:  Yes Relieved by:  Nothing Worsened by:  Nothing tried Ineffective treatments:  None tried Associated symptoms: no fatigue, no fever and no neck pain   Risk factors: no concern for non-accidental trauma, no frequent fractures, no known bone disorder, no obesity and no recent illness     Past Medical History  Diagnosis Date  . Gout   . Knee effusion, left    Past Surgical History  Procedure Laterality Date  . Anterior cruciate ligament repair     No family history on file. History  Substance Use Topics  . Smoking status: Never Smoker   . Smokeless tobacco: Never Used  . Alcohol Use: 1.8 oz/week    3 Cans of beer per week     Comment: daily    Review of Systems  Constitutional: Negative for fever, chills and fatigue.  HENT: Negative for trouble swallowing.   Eyes: Negative for visual disturbance.  Respiratory: Negative for shortness of breath.   Cardiovascular: Negative for chest pain and palpitations.  Gastrointestinal: Negative for nausea, vomiting, abdominal pain and diarrhea.  Genitourinary: Negative for dysuria and difficulty urinating.  Musculoskeletal: Positive for  joint swelling and arthralgias. Negative for neck pain.  Skin: Negative for color change.  Neurological: Negative for dizziness and weakness.  Psychiatric/Behavioral: Negative for dysphoric mood.      Allergies  Review of patient's allergies indicates no known allergies.  Home Medications   Prior to Admission medications   Medication Sig Start Date End Date Taking? Authorizing Provider  allopurinol (ZYLOPRIM) 100 MG tablet Take 1 tablet (100 mg total) by mouth daily. 06/05/13   Kristen N Ward, DO  Diphenhydramine-Acetaminophen (TYLENOL COLD RELIEF PO) Take 5 mLs by mouth every 4 (four) hours as needed. For cold    Historical Provider, MD  hydrochlorothiazide (HYDRODIURIL) 25 MG tablet Take 1 tablet (25 mg total) by mouth daily. 03/02/14   Richardean Canal, MD  indomethacin (INDOCIN) 25 MG capsule Take 1 capsule (25 mg total) by mouth 3 (three) times daily as needed. 06/05/13   Kristen N Ward, DO  predniSONE (DELTASONE) 10 MG tablet 6,5,4,3,2,1 taper 03/02/14   Richardean Canal, MD   BP 192/109 mmHg  Pulse 95  Temp(Src) 98.6 F (37 C) (Oral)  Ht  (1.753 m)  Wt 195 lb (88.451 kg)  BMI 28.78 kg/m2  SpO2 99% Physical Exam  Constitutional: He is oriented to person, place, and time. He appears well-developed and well-nourished. No distress.  HENT:  Head: Normocephalic and atraumatic.  Eyes: Conjunctivae and EOM are normal.  Neck: Normal range of motion.  Cardiovascular: Normal  rate and regular rhythm.  Exam reveals no gallop and no friction rub.   No murmur heard. Pulmonary/Chest: Effort normal and breath sounds normal. He has no wheezes. He has no rales. He exhibits no tenderness.  Abdominal: Soft. He exhibits no distension. There is no tenderness. There is no rebound.  Musculoskeletal: Normal range of motion.  Limited ROM of right knee due to pain and swelling. No obvious deformity. Positive balloon sign of right knee. Generalized tenderness to palpation.   Neurological: He is alert and  oriented to person, place, and time. Coordination normal.  Speech is goal-oriented. Moves limbs without ataxia.   Skin: Skin is warm and dry.  Psychiatric: He has a normal mood and affect. His behavior is normal.  Nursing note and vitals reviewed.   ED Course  ARTHOCENTESIS Date/Time: 05/23/2014 12:52 PM Performed by: Emilia BeckSZEKALSKI, Charnell Peplinski Authorized by: Emilia BeckSZEKALSKI, Deva Ron Consent: Verbal consent obtained. Risks and benefits: risks, benefits and alternatives were discussed Consent given by: patient Patient understanding: patient states understanding of the procedure being performed Patient consent: the patient's understanding of the procedure matches consent given Patient identity confirmed: verbally with patient Time out: Immediately prior to procedure a "time out" was called to verify the correct patient, procedure, equipment, support staff and site/side marked as required. Indications: joint swelling  Body area: knee Joint: right knee Local anesthesia used: yes Anesthesia: local infiltration Local anesthetic: lidocaine 1% without epinephrine Anesthetic total: 4 ml Patient sedated: no Preparation: Patient was prepped and draped in the usual sterile fashion. Needle gauge: 18 G Ultrasound guidance: no Approach: medial Aspirate: bloody Aspirate amount: 35 mL Patient tolerance: Patient tolerated the procedure well with no immediate complications   (including critical care time) Labs Review Labs Reviewed - No data to display  Imaging Review No results found.   EKG Interpretation None      MDM   Final diagnoses:  Knee effusion, right    12:51 PM Patient tolerated arthrocentesis of his knee. Patient reports improvement of symptoms. Patient will have vicodin for pain. Patient advised to follow up with his sports medicine doctor on Monday. Vitals stable and patient afebrile. No signs of joint infection.   Emilia BeckKaitlyn Amedeo Detweiler, PA-C 05/24/14 [id number]  Toy CookeyMegan Docherty,  MD 05/25/14 2218

## 2014-05-23 NOTE — ED Notes (Signed)
Rt knee medial and lateral area cleaned after procedure by ED PA-C, bandaids applied by PA to sites of Rt knee

## 2014-05-23 NOTE — ED Notes (Signed)
Pt presents with Lt knee swelling, onset on thursday

## 2014-05-23 NOTE — ED Notes (Signed)
Pt states had knee surgery in 1998 and since then experiences intermittent fluid accumulation in his right knee.  Pt last had fluid drained from his knee approx 1 month ago and woke up yesterday morning with right knee swollen and painful.  Pt took tylenol 1000mg  for pain around 9am this morning with no relief.

## 2014-05-25 ENCOUNTER — Encounter: Payer: Self-pay | Admitting: Family Medicine

## 2014-05-25 ENCOUNTER — Ambulatory Visit (INDEPENDENT_AMBULATORY_CARE_PROVIDER_SITE_OTHER): Payer: Self-pay | Admitting: Family Medicine

## 2014-05-25 VITALS — BP 135/90 | HR 99 | Ht 69.0 in | Wt 195.0 lb

## 2014-05-25 DIAGNOSIS — M25461 Effusion, right knee: Secondary | ICD-10-CM

## 2014-05-25 MED ORDER — METHYLPREDNISOLONE ACETATE 40 MG/ML IJ SUSP
40.0000 mg | Freq: Once | INTRAMUSCULAR | Status: AC
  Administered 2014-05-25: 40 mg via INTRA_ARTICULAR

## 2014-05-25 MED ORDER — HYDROCODONE-ACETAMINOPHEN 5-325 MG PO TABS: 1.0000 | ORAL_TABLET | Freq: Four times a day (QID) | ORAL | Status: DC | PRN

## 2014-05-25 NOTE — Patient Instructions (Signed)
Your knee pain and swelling are due to gout. We pulled off the fluid and injected you with cortisone. This can take a couple days to kick in but should help tremendously. Take the pain medication as prescribed from the ED. Follow up with me in 1 month for reevaluation. Alcohol and red meat are associated with increased gout flare frequency.

## 2014-05-26 NOTE — Progress Notes (Signed)
PCP: No primary care provider on file.  Subjective:   HPI: Patient is a 49 y.o. male here for right knee effusion.  2/8: Patient has known history of gout. States over past week has had worsening pain, swelling in right knee. He had gout in his foot prior to this - took indomethacin and prednisone. Feels like a gout flare. No catching, locking, giving out. Remotely tore this ACL.  2/16: Patient reports he has improved since last week but pain still 6/10 and difficulty bearing weight. Swelling has improved but some still present.  4/18: Patient reports his right knee started to become sore again on Friday morning. Increased swelling as well - very severe by Saturday and came to ED. Had 35mL aspirated from his knee - bloody appearance to this. Denies known injury. Taking norco as needed.  Past Medical History  Diagnosis Date  . Gout   . Knee effusion, left     Current Outpatient Prescriptions on File Prior to Visit  Medication Sig Dispense Refill  . allopurinol (ZYLOPRIM) 100 MG tablet Take 1 tablet (100 mg total) by mouth daily. 30 tablet 0  . Diphenhydramine-Acetaminophen (TYLENOL COLD RELIEF PO) Take 5 mLs by mouth every 4 (four) hours as needed. For cold    . hydrochlorothiazide (HYDRODIURIL) 25 MG tablet Take 1 tablet (25 mg total) by mouth daily. 30 tablet 0  . indomethacin (INDOCIN) 25 MG capsule Take 1 capsule (25 mg total) by mouth 3 (three) times daily as needed. 30 capsule 0  . predniSONE (DELTASONE) 10 MG tablet 6,5,4,3,2,1 taper 21 tablet 0   No current facility-administered medications on file prior to visit.    Past Surgical History  Procedure Laterality Date  . Anterior cruciate ligament repair      No Known Allergies  History   Social History  . Marital Status: Married    Spouse Name: N/A  . Number of Children: N/A  . Years of Education: N/A   Occupational History  . Not on file.   Social History Main Topics  . Smoking status: Never Smoker    . Smokeless tobacco: Never Used  . Alcohol Use: 1.8 oz/week    3 Cans of beer per week     Comment: daily  . Drug Use: No  . Sexual Activity: Yes    Birth Control/ Protection: Condom   Other Topics Concern  . Not on file   Social History Narrative    No family history on file.  BP 135/90 mmHg  Pulse 99  Ht 5\' 9"  (1.753 m)  Wt 195 lb (88.451 kg)  BMI 28.78 kg/m2  Review of Systems: See HPI above.    Objective:  Physical Exam:  Gen: NAD  Right knee: Mod effusion.  No bruising, other deformity.  Warmth but no erythema. Diffuse anterior tenderness. ROM 0 - 90 degrees, slow and painful. Negative ant/post drawers. Negative valgus/varus testing. Negative lachmanns. Negative mcmurrays, apleys, patellar apprehension. NV intact distally.    Assessment & Plan:  1. Right knee effusion/pain - consistent with acute gout flare.  Unusual that fluid is more bloody this time but he denies known injury, presentation identical to last flare.  Discussed prednisone, aspiration and injection, or injection - he wanted to repeat aspiration with injection which was done today.  Call us if not improving over the next week and we will go ahead with prednisone dose pack.  After informed written consent patient was lying supine on exam table.  Right knee was prepped with  alcohol swab.  Utilizing superolateral approach under ultrasound guidance, 25mL of sanguinous cloudy fluid was aspirated from right knee then right knee was injected with 3:1 marcaine:depomedrol.  Patient tolerated procedure well without immediate complications

## 2014-05-26 NOTE — Assessment & Plan Note (Signed)
consistent with acute gout flare.  Unusual that fluid is more bloody this time but he denies known injury, presentation identical to last flare.  Discussed prednisone, aspiration and injection, or injection - he wanted to repeat aspiration with injection which was done today.  Call us if not improving over the next week and we will go ahead with prednisone dose pack.  After informed written consent patient was lying supine on exam table.  Right knee was prepped with alcohol swab.  Utilizing superolateral approach under ultrasound guidance, 25mL of sanguinous cloudy fluid was aspirated from right knee then right knee was injected with 3:1 marcaine:depomedrol.  Patient tolerated procedure well without immediate complications

## 2014-06-24 ENCOUNTER — Ambulatory Visit: Payer: Self-pay | Admitting: Family Medicine

## 2014-09-03 ENCOUNTER — Emergency Department (HOSPITAL_BASED_OUTPATIENT_CLINIC_OR_DEPARTMENT_OTHER): Payer: Self-pay

## 2014-09-03 ENCOUNTER — Emergency Department (HOSPITAL_BASED_OUTPATIENT_CLINIC_OR_DEPARTMENT_OTHER)
Admission: EM | Admit: 2014-09-03 | Discharge: 2014-09-03 | Disposition: A | Discharge status: Home / Self Care | Payer: Self-pay | Attending: Emergency Medicine | Admitting: Emergency Medicine

## 2014-09-03 ENCOUNTER — Encounter (HOSPITAL_BASED_OUTPATIENT_CLINIC_OR_DEPARTMENT_OTHER): Payer: Self-pay | Admitting: *Deleted

## 2014-09-03 DIAGNOSIS — Z79899 Other long term (current) drug therapy: Secondary | ICD-10-CM | POA: Insufficient documentation

## 2014-09-03 DIAGNOSIS — M10072 Idiopathic gout, left ankle and foot: Secondary | ICD-10-CM | POA: Insufficient documentation

## 2014-09-03 DIAGNOSIS — M1000 Idiopathic gout, unspecified site: Secondary | ICD-10-CM

## 2014-09-03 MED ORDER — INDOMETHACIN 25 MG PO CAPS: 25.0000 mg | ORAL_CAPSULE | Freq: Three times a day (TID) | ORAL | Status: DC | PRN

## 2014-09-03 MED ORDER — HYDROCODONE-ACETAMINOPHEN 5-325 MG PO TABS: 1.0000 | ORAL_TABLET | Freq: Four times a day (QID) | ORAL | Status: DC | PRN

## 2014-09-03 NOTE — Discharge Instructions (Signed)
Gout °Gout is when your joints become red, sore, and swell (inflamed). This is caused by the buildup of uric acid crystals in the joints. Uric acid is a chemical that is normally in the blood. If the level of uric acid gets too high in the blood, these crystals form in your joints and tissues. Over time, these crystals can form into masses near the joints and tissues. These masses can destroy bone and cause the bone to look misshapen (deformed). °HOME CARE  °· Do not take aspirin for pain. °· Only take medicine as told by your doctor. °· Rest the joint as much as you can. When in bed, keep sheets and blankets off painful areas. °· Keep the sore joints raised (elevated). °· Put warm or cold packs on painful joints. Use of warm or cold packs depends on which works best for you. °· Use crutches if the painful joint is in your leg. °· Drink enough fluids to keep your pee (urine) clear or pale yellow. Limit alcohol, sugary drinks, and drinks with fructose in them. °· Follow your diet instructions. Pay careful attention to how much protein you eat. Include fruits, vegetables, whole grains, and fat-free or low-fat milk products in your daily diet. Talk to your doctor or dietitian about the use of coffee, vitamin C, and cherries. These may help lower uric acid levels. °· Keep a healthy body weight. °GET HELP RIGHT AWAY IF:  °· You have watery poop (diarrhea), throw up (vomit), or have any side effects from medicines. °· You do not feel better in 24 hours, or you are getting worse. °· Your joint becomes suddenly more tender, and you have chills or a fever. °MAKE SURE YOU:  °· Understand these instructions. °· Will watch your condition. °· Will get help right away if you are not doing well or get worse. °Document Released: 11/02/2007 Document Revised: 06/09/2013 Document Reviewed: 09/06/2011 °[employer]® Patient Information ©2015 [employer]. This information is not intended to replace advice given to you by your health care  provider. Make sure you discuss any questions you have with your health care provider. ° °

## 2014-09-03 NOTE — ED Notes (Signed)
Pt states he has had gout several times, and this is his typical gout flare. Pt declines xray at this time.

## 2014-09-03 NOTE — ED Provider Notes (Signed)
CSN: [id number]     Arrival date & time 09/03/14  1326 History   First MD Initiated Contact with Patient 09/03/14 1452     Chief Complaint  Patient presents with  . Ankle Pain     (Consider location/radiation/quality/duration/timing/severity/associated sxs/prior Treatment) Patient is a 49 y.o. male presenting with ankle pain. The history is provided by the patient.  Ankle Pain Location:  Ankle Time since incident:  2 days Injury: no   Ankle location:  L ankle Pain details:    Quality:  Aching   Radiates to:  Does not radiate   Severity:  Moderate   Onset quality:  Gradual   Duration:  2 days   Timing:  Constant   Progression:  Unchanged Chronicity:  Recurrent Dislocation: no   Foreign body present:  No foreign bodies Prior injury to area:  No Relieved by:  Nothing Worsened by:  Nothing tried Ineffective treatments:  Immobilization and elevation Associated symptoms: no fever and no neck pain     Past Medical History  Diagnosis Date  . Gout   . Knee effusion, left    Past Surgical History  Procedure Laterality Date  . Anterior cruciate ligament repair     No family history on file. History  Substance Use Topics  . Smoking status: Never Smoker   . Smokeless tobacco: Never Used  . Alcohol Use: 1.8 oz/week    3 Cans of beer per week     Comment: daily    Review of Systems  Constitutional: Negative for fever.  HENT: Negative for drooling and rhinorrhea.   Eyes: Negative for pain.  Respiratory: Negative for cough and shortness of breath.   Cardiovascular: Negative for chest pain and leg swelling.  Gastrointestinal: Negative for nausea, vomiting, abdominal pain and diarrhea.  Genitourinary: Negative for dysuria and hematuria.  Musculoskeletal: Negative for gait problem and neck pain.  Skin: Negative for color change.  Neurological: Negative for numbness and headaches.  Hematological: Negative for adenopathy.  Psychiatric/Behavioral: Negative for behavioral  problems.  All other systems reviewed and are negative.     Allergies  Review of patient's allergies indicates no known allergies.  Home Medications   Prior to Admission medications   Medication Sig Start Date End Date Taking? Authorizing Provider  allopurinol (ZYLOPRIM) 100 MG tablet Take 1 tablet (100 mg total) by mouth daily. 06/05/13   Kristen N Ward, DO  Diphenhydramine-Acetaminophen (TYLENOL COLD RELIEF PO) Take 5 mLs by mouth every 4 (four) hours as needed. For cold    Historical Provider, MD  hydrochlorothiazide (HYDRODIURIL) 25 MG tablet Take 1 tablet (25 mg total) by mouth daily. 03/02/14   Richardean Canal, MD  HYDROcodone-acetaminophen (NORCO) 5-325 MG per tablet Take 1-2 tablets by mouth every 6 (six) hours as needed for moderate pain. 09/03/14   Purvis Sheffield, MD  HYDROcodone-acetaminophen (NORCO/VICODIN) 5-325 MG per tablet Take 1 tablet by mouth every 6 (six) hours as needed. 05/25/14   Lenda Kelp, MD  indomethacin (INDOCIN) 25 MG capsule Take 1 capsule (25 mg total) by mouth 3 (three) times daily as needed. 06/05/13   Kristen N Ward, DO  indomethacin (INDOCIN) 25 MG capsule Take 1 capsule (25 mg total) by mouth 3 (three) times daily as needed. 09/03/14   Purvis Sheffield, MD  predniSONE (DELTASONE) 10 MG tablet 6,5,4,3,2,1 taper 03/02/14   Richardean Canal, MD   BP 175/98 mmHg  Pulse 92  Temp(Src) 98.4 F (36.9 C) (Oral)  Resp 18  Ht  (  1.753 m)  Wt 200 lb (90.719 kg)  BMI 29.52 kg/m2  SpO2 100% Physical Exam  Constitutional: He is oriented to person, place, and time. He appears well-developed and well-nourished.  HENT:  Head: Normocephalic and atraumatic.  Right Ear: External ear normal.  Left Ear: External ear normal.  Nose: Nose normal.  Mouth/Throat: Oropharynx is clear and moist. No oropharyngeal exudate.  Eyes: Conjunctivae and EOM are normal. Pupils are equal, round, and reactive to light.  Neck: Normal range of motion. Neck supple.  Cardiovascular: Normal  rate, regular rhythm, normal heart sounds and intact distal pulses.  Exam reveals no gallop and no friction rub.   No murmur heard. Pulmonary/Chest: Effort normal and breath sounds normal. No respiratory distress. He has no wheezes.  Abdominal: Soft. Bowel sounds are normal. He exhibits no distension. There is no tenderness. There is no rebound and no guarding.  Musculoskeletal: He exhibits tenderness. He exhibits no edema.  Mild circumferential swelling to left ankle w/ bimalleolar ttp. Mildly limited rom d/t pain.   No obvious erythema in the LLE.   2+ distal DP pulses in LE's.   Neurological: He is alert and oriented to person, place, and time.  Skin: Skin is warm and dry.  Psychiatric: He has a normal mood and affect. His behavior is normal.  Nursing note and vitals reviewed.   ED Course  Procedures (including critical care time) Labs Review Labs Reviewed - No data to display  Imaging Review No results found.   EKG Interpretation None      MDM   Final diagnoses:  Acute idiopathic gout, unspecified site    3:20 PM 49 y.o. male with a history of gout who presents with left ankle pain and swelling which began 2 days ago. Symptoms consistent with gout flare. He denies any fevers or other associated symptoms. Vital signs unremarkable here. Exam not concerning for septic arthritis. Will treat symptomatically with indomethacin and Norco. Pt refused plain film, but do not think it is needed given hx of recurrent sx.     Purvis Sheffield, MD 09/03/14 [id number]

## 2014-09-03 NOTE — ED Notes (Signed)
Left ankle pain x 2 days.

## 2014-09-14 ENCOUNTER — Emergency Department (HOSPITAL_BASED_OUTPATIENT_CLINIC_OR_DEPARTMENT_OTHER)
Admission: EM | Admit: 2014-09-14 | Discharge: 2014-09-14 | Disposition: A | Discharge status: Home / Self Care | Payer: Self-pay | Attending: Emergency Medicine | Admitting: Emergency Medicine

## 2014-09-14 ENCOUNTER — Encounter (HOSPITAL_BASED_OUTPATIENT_CLINIC_OR_DEPARTMENT_OTHER): Payer: Self-pay | Admitting: *Deleted

## 2014-09-14 DIAGNOSIS — Z79899 Other long term (current) drug therapy: Secondary | ICD-10-CM | POA: Insufficient documentation

## 2014-09-14 DIAGNOSIS — M10072 Idiopathic gout, left ankle and foot: Secondary | ICD-10-CM | POA: Insufficient documentation

## 2014-09-14 MED ORDER — HYDROCODONE-ACETAMINOPHEN 5-325 MG PO TABS: 1.0000 | ORAL_TABLET | Freq: Four times a day (QID) | ORAL | Status: DC | PRN

## 2014-09-14 MED ORDER — INDOMETHACIN 25 MG PO CAPS: 25.0000 mg | ORAL_CAPSULE | Freq: Three times a day (TID) | ORAL | Status: DC | PRN

## 2014-09-14 NOTE — ED Provider Notes (Signed)
CSN: [id number]     Arrival date & time 09/14/14  1333 History   First MD Initiated Contact with Patient 09/14/14 1354     Chief Complaint  Patient presents with  . Ankle Pain     (Consider location/radiation/quality/duration/timing/severity/associated sxs/prior Treatment) Patient is a 49 y.o. male presenting with ankle pain.  Ankle Pain Location:  Ankle Time since incident:  2 weeks Injury: no   Ankle location:  L ankle Pain details:    Quality:  Aching   Radiates to:  Does not radiate   Severity:  Moderate   Onset quality:  Gradual   Duration:  2 weeks   Timing:  Constant   Progression:  Unchanged Chronicity:  Recurrent Relieved by:  NSAIDs (narcotics) Worsened by:  Bearing weight, flexion and extension Associated symptoms: decreased ROM (due to pain) and swelling   Associated symptoms: no back pain, no fatigue, no fever, no muscle weakness and no tingling     Past Medical History  Diagnosis Date  . Gout   . Knee effusion, left    Past Surgical History  Procedure Laterality Date  . Anterior cruciate ligament repair     No family history on file. History  Substance Use Topics  . Smoking status: Never Smoker   . Smokeless tobacco: Never Used  . Alcohol Use: 1.8 oz/week    3 Cans of beer per week     Comment: daily    Review of Systems  Constitutional: Negative for fever and fatigue.  Musculoskeletal: Negative for back pain.  All other systems reviewed and are negative.     Allergies  Review of patient's allergies indicates no known allergies.  Home Medications   Prior to Admission medications   Medication Sig Start Date End Date Taking? Authorizing Provider  allopurinol (ZYLOPRIM) 100 MG tablet Take 1 tablet (100 mg total) by mouth daily. 06/05/13   Kristen N Ward, DO  Diphenhydramine-Acetaminophen (TYLENOL COLD RELIEF PO) Take 5 mLs by mouth every 4 (four) hours as needed. For cold    Historical Provider, MD  hydrochlorothiazide (HYDRODIURIL) 25 MG  tablet Take 1 tablet (25 mg total) by mouth daily. 03/02/14   Richardean Canal, MD  HYDROcodone-acetaminophen (NORCO) 5-325 MG per tablet Take 1-2 tablets by mouth every 6 (six) hours as needed for moderate pain. 09/14/14   Mirian Mo, MD  indomethacin (INDOCIN) 25 MG capsule Take 1 capsule (25 mg total) by mouth 3 (three) times daily as needed. 09/14/14   Mirian Mo, MD  predniSONE (DELTASONE) 10 MG tablet 6,5,4,3,2,1 taper 03/02/14   Richardean Canal, MD   BP 106/73 mmHg  Pulse 94  Temp(Src) 97.8 F (36.6 C) (Oral)  Resp 18  Ht 5\' 9"  (1.753 m)  Wt 200 lb (90.719 kg)  BMI 29.52 kg/m2  SpO2 100% Physical Exam  Constitutional: He is oriented to person, place, and time. He appears well-developed and well-nourished.  HENT:  Head: Normocephalic and atraumatic.  Eyes: Conjunctivae and EOM are normal.  Neck: Normal range of motion. Neck supple.  Cardiovascular: Normal rate, regular rhythm and normal heart sounds.   Pulmonary/Chest: Effort normal and breath sounds normal. No respiratory distress.  Abdominal: He exhibits no distension. There is no tenderness. There is no rebound and no guarding.  Musculoskeletal:       Left ankle: He exhibits decreased range of motion (due to pain, but has FROM passively and moderate AROM) and swelling. He exhibits normal pulse. Tenderness.  Neurological: He is alert and oriented to  person, place, and time.  Skin: Skin is warm and dry.  Vitals reviewed.   ED Course  Procedures (including critical care time) Labs Review Labs Reviewed - No data to display  Imaging Review No results found.   EKG Interpretation None      MDM   Final diagnoses:  Acute idiopathic gout of left ankle    49 y.o. male with pertinent PMH of gout presents with recurrent similar pain.  No trauma.  Seen for identical symptoms, prescribed norco and indomethacin, which he states helped.  Symptoms recurred.  Exam today consistent with gout.  Pt has some ROM, so doubt septic  arthritis, and no systemic symptoms.  DC home in stable condition.    I have reviewed all laboratory and imaging studies if ordered as above  1. Acute idiopathic gout of left ankle         Mirian Mo, MD 09/14/14 1410

## 2014-09-14 NOTE — Discharge Instructions (Signed)

## 2014-09-14 NOTE — ED Notes (Signed)
Pt seen here last wek for left ankle gout pain. Pt sts pain is not subsiding.

## 2014-09-26 ENCOUNTER — Encounter (HOSPITAL_BASED_OUTPATIENT_CLINIC_OR_DEPARTMENT_OTHER): Payer: Self-pay | Admitting: *Deleted

## 2014-09-26 ENCOUNTER — Emergency Department (HOSPITAL_BASED_OUTPATIENT_CLINIC_OR_DEPARTMENT_OTHER)
Admission: EM | Admit: 2014-09-26 | Discharge: 2014-09-26 | Disposition: A | Discharge status: Home / Self Care | Payer: Self-pay | Attending: Emergency Medicine | Admitting: Emergency Medicine

## 2014-09-26 DIAGNOSIS — M25462 Effusion, left knee: Secondary | ICD-10-CM | POA: Insufficient documentation

## 2014-09-26 DIAGNOSIS — Z8739 Personal history of other diseases of the musculoskeletal system and connective tissue: Secondary | ICD-10-CM

## 2014-09-26 DIAGNOSIS — Z79899 Other long term (current) drug therapy: Secondary | ICD-10-CM | POA: Insufficient documentation

## 2014-09-26 DIAGNOSIS — M109 Gout, unspecified: Secondary | ICD-10-CM | POA: Insufficient documentation

## 2014-09-26 LAB — SYNOVIAL CELL COUNT + DIFF, W/ CRYSTALS
CRYSTALS FLUID: NONE SEEN
Eosinophils-Synovial: 2 % — ABNORMAL HIGH (ref 0–1)
Lymphocytes-Synovial Fld: 3 % (ref 0–20)
MONOCYTE-MACROPHAGE-SYNOVIAL FLUID: 1 % — AB (ref 50–90)
NEUTROPHIL, SYNOVIAL: 94 % — AB (ref 0–25)
WBC, SYNOVIAL: 4649 /mm3 — AB (ref 0–200)

## 2014-09-26 MED ORDER — HYDROCODONE-ACETAMINOPHEN 5-325 MG PO TABS: 2.0000 | ORAL_TABLET | ORAL | Status: DC | PRN

## 2014-09-26 MED ORDER — PREDNISONE 10 MG PO TABS: 20.0000 mg | ORAL_TABLET | Freq: Two times a day (BID) | ORAL | Status: DC

## 2014-09-26 MED ORDER — LIDOCAINE HCL (PF) 1 % IJ SOLN
5.0000 mL | Freq: Once | INTRAMUSCULAR | Status: AC
  Administered 2014-09-26: 5 mL via INTRADERMAL
  Filled 2014-09-26: qty 5

## 2014-09-26 NOTE — ED Notes (Signed)
Patient c/o L leg pain that has grown worse and states his L knee hurts now. He states that he has completed his prescriptions

## 2014-09-26 NOTE — ED Provider Notes (Signed)
CSN: [id number]     Arrival date & time 09/26/14  1208 History  This chart was scribed for Geoffery Lyons, MD by Budd Palmer, ED Scribe. This patient was seen in room MHT13/MHT13 and the patient's care was started at 5:22 PM.    Chief Complaint  Patient presents with  . Leg Pain   Patient is a 49 y.o. male presenting with leg pain. The history is provided by the patient. No language interpreter was used.  Leg Pain Location:  Leg Leg location:  L lower leg Pain details:    Quality:  Aching   Radiates to:  L leg   Severity:  Moderate   Onset quality:  Gradual   Timing:  Constant Chronicity:  Recurrent Dislocation: no   Foreign body present:  No foreign bodies Associated symptoms: decreased ROM and swelling    HPI Comments: Lawrence [name] is a 49 y.o. male with a PMHx of gout who presents to the Emergency Department complaining of recurrent, worsening left leg pain onset 3 days ago. He reports associated feeling of fluid in the knee. He states he has been to the ED a few times last week for gout flare up in the left ankle. He notes he was working this week, and has been limping badly. He has taken all of the medication that he was given at the ED, which was 3 pills of 25 mg with mild relief. He states he was not given prednisone at the time. He notes that in the past, drainage of the fluid has helped relieve the pain. 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    Past Medical History  Diagnosis Date  . Gout   . Knee effusion, left    Past Surgical History  Procedure Laterality Date  . Anterior cruciate ligament repair     History reviewed. No pertinent family history. Social History  Substance Use Topics  . Smoking status: Never Smoker   . Smokeless tobacco: Never Used  . Alcohol Use: 1.8 oz/week    3 Cans of beer per week     Comment: daily    Review of Systems  Musculoskeletal: Positive for joint swelling and arthralgias.  All other systems reviewed and are negative.   Allergies   Review of patient's allergies indicates no known allergies.  Home Medications   Prior to Admission medications   Medication Sig Start Date End Date Taking? Authorizing Provider  allopurinol (ZYLOPRIM) 100 MG tablet Take 1 tablet (100 mg total) by mouth daily. 06/05/13   Kristen N Ward, DO  Diphenhydramine-Acetaminophen (TYLENOL COLD RELIEF PO) Take 5 mLs by mouth every 4 (four) hours as needed. For cold    Historical Provider, MD  hydrochlorothiazide (HYDRODIURIL) 25 MG tablet Take 1 tablet (25 mg total) by mouth daily. 03/02/14   Richardean Canal, MD  HYDROcodone-acetaminophen (NORCO) 5-325 MG per tablet Take 1-2 tablets by mouth every 6 (six) hours as needed for moderate pain. 09/14/14   Mirian Mo, MD  indomethacin (INDOCIN) 25 MG capsule Take 1 capsule (25 mg total) by mouth 3 (three) times daily as needed. 09/14/14   Mirian Mo, MD  predniSONE (DELTASONE) 10 MG tablet 6,5,4,3,2,1 taper 03/02/14   Richardean Canal, MD   BP 135/87 mmHg  Pulse 91  Temp(Src) 98.7 F (37.1 C) (Oral)  Resp 20  Ht  (1.753 m)  Wt 195 lb (88.451 kg)  BMI 28.78 kg/m2  SpO2 97% Physical Exam  Constitutional: He appears well-developed and well-nourished.  HENT:  Head: Normocephalic and atraumatic.  Eyes: Conjunctivae are normal. Right eye exhibits no discharge. Left eye exhibits no discharge.  Pulmonary/Chest: Effort normal. No respiratory distress.  Musculoskeletal: He exhibits edema and tenderness.  The left knee has a moderate sized effusion. It is slightly warm to the touch, but no erythema. Pain with ROM.  Neurological: He is alert. Coordination normal.  Skin: Skin is warm and dry. No rash noted. He is not diaphoretic. No erythema.  Psychiatric: He has a normal mood and affect.  Nursing note and vitals reviewed.   ED Course  ARTHOCENTESIS Date/Time: 09/28/2014 11:14 PM Performed by: Geoffery Lyons Authorized by: Geoffery Lyons Consent: Verbal consent obtained. Risks and benefits: risks, benefits  and alternatives were discussed Consent given by: patient Patient understanding: patient states understanding of the procedure being performed Patient consent: the patient's understanding of the procedure matches consent given Procedure consent: procedure consent matches procedure scheduled Relevant documents: relevant documents present and verified Test results: test results available and properly labeled Patient identity confirmed: verbally with patient Time out: Immediately prior to procedure a "time out" was called to verify the correct patient, procedure, equipment, support staff and site/side marked as required. Indications: joint swelling,  pain and diagnostic evaluation  Body area: knee Joint: right knee Local anesthesia used: yes Anesthesia: local infiltration Local anesthetic: lidocaine 1% without epinephrine Anesthetic total: 5 ml Patient sedated: no Preparation: Patient was prepped and draped in the usual sterile fashion. Needle gauge: 18 G Ultrasound guidance: no Approach: medial Aspirate: blood-tinged Aspirate amount: 20 mL Comments: 20 mL of bloody/yellow fluid was obtained. Patient tolerated this procedure well. Fluid was sent for the usual studies.    DIAGNOSTIC STUDIES: Oxygen Saturation is 97% on RA, adequate by my interpretation.    COORDINATION OF CARE: 5:30 PM - Discussed plans to order prednisone and refer to a rheumatologist. Pt advised of plan for treatment and pt agrees.  Labs Review Labs Reviewed - No data to display  Imaging Review No results found. I have personally reviewed and evaluated these images and lab results as part of my medical decision-making.   EKG Interpretation None      MDM   Final diagnoses:  None    Cell count with differential is not suggestive of a septic joint. He will be treated with pain medication and follow-up with an orthopedic doctor or rheumatologist to discuss the frequency of his gout recurrence.  I  personally performed the services described in this documentation, which was scribed in my presence. The recorded information has been reviewed and is accurate.      Geoffery Lyons, MD 09/28/14 [phone]

## 2014-09-26 NOTE — ED Notes (Signed)
Discharge instructions and prescriptions given and reviewed with patient.  Patient verbalized understanding to take medications as directed and to follow up with MD as needed.  Patient discharged home in good condition.

## 2014-09-26 NOTE — Discharge Instructions (Signed)
Prednisone as prescribed.  Hydrocodone as prescribed as needed for pain.  Follow-up with rheumatology. The contact information for Dr. Corliss Skains has been provided in this discharge summary. Please call to arrange this appointment.   Knee Effusion The medical term for having fluid in your knee is effusion. This is often due to an internal derangement of the knee. This means something is wrong inside the knee. Some of the causes of fluid in the knee may be torn cartilage, a torn ligament, or bleeding into the joint from an injury. Your knee is likely more difficult to bend and move. This is often because there is increased pain and pressure in the joint. The time it takes for recovery from a knee effusion depends on different factors, including:   Type of injury.  Your age.  Physical and medical conditions.  Rehabilitation Strategies. How long you will be away from your normal activities will depend on what kind of knee problem you have and how much damage is present. Your knee has two types of cartilage. Articular cartilage covers the bone ends and lets your knee bend and move smoothly. Two menisci, thick pads of cartilage that form a rim inside the joint, help absorb shock and stabilize your knee. Ligaments bind the bones together and support your knee joint. Muscles move the joint, help support your knee, and take stress off the joint itself. CAUSES  Often an effusion in the knee is caused by an injury to one of the menisci. This is often a tear in the cartilage. Recovery after a meniscus injury depends on how much meniscus is damaged and whether you have damaged other knee tissue. Small tears may heal on their own with conservative treatment. Conservative means rest, limited weight bearing activity and muscle strengthening exercises. Your recovery may take up to 6 weeks.  TREATMENT  Larger tears may require surgery. Meniscus injuries may be treated during arthroscopy. Arthroscopy is a  procedure in which your surgeon uses a small telescope like instrument to look in your knee. Your caregiver can make a more accurate diagnosis (learning what is wrong) by performing an arthroscopic procedure. If your injury is on the inner margin of the meniscus, your surgeon may trim the meniscus back to a smooth rim. In other cases your surgeon will try to repair a damaged meniscus with stitches (sutures). This may make rehabilitation take longer, but may provide better long term result by helping your knee keep its shock absorption capabilities. Ligaments which are completely torn usually require surgery for repair. HOME CARE INSTRUCTIONS  Use crutches as instructed.  If a brace is applied, use as directed.  Once you are home, an ice pack applied to your swollen knee may help with discomfort and help decrease swelling.  Keep your knee raised (elevated) when you are not up and around or on crutches.  Only take over-the-counter or prescription medicines for pain, discomfort, or fever as directed by your caregiver.  Your caregivers will help with instructions for rehabilitation of your knee. This often includes strengthening exercises.  You may resume a normal diet and activities as directed. SEEK MEDICAL CARE IF:   There is increased swelling in your knee.  You notice redness, swelling, or increasing pain in your knee.  An unexplained oral temperature above 102 F (38.9 C) develops. SEEK IMMEDIATE MEDICAL CARE IF:   You develop a rash.  You have difficulty breathing.  You have any allergic reactions from medications you may have been given.  There is  severe pain with any motion of the knee. MAKE SURE YOU:   Understand these instructions.  Will watch your condition.  Will get help right away if you are not doing well or get worse. Document Released: 04/15/2003 Document Revised: 04/17/2011 Document Reviewed: 06/19/2007 [facility] Patient Information 2015 [place], [place]. This  information is not intended to replace advice given to you by your health care provider. Make sure you discuss any questions you have with your health care provider.

## 2014-09-29 ENCOUNTER — Ambulatory Visit (INDEPENDENT_AMBULATORY_CARE_PROVIDER_SITE_OTHER): Payer: Self-pay | Admitting: Family Medicine

## 2014-09-29 DIAGNOSIS — M25562 Pain in left knee: Secondary | ICD-10-CM

## 2014-09-29 DIAGNOSIS — M25461 Effusion, right knee: Secondary | ICD-10-CM

## 2014-09-29 MED ORDER — METHYLPREDNISOLONE ACETATE 40 MG/ML IJ SUSP
40.0000 mg | Freq: Once | INTRAMUSCULAR | Status: AC
  Administered 2014-09-29: 40 mg via INTRA_ARTICULAR

## 2014-09-29 NOTE — Patient Instructions (Signed)
Your knee pain and swelling are due to gout. We pulled off the fluid and injected you with cortisone. This can take a couple days to kick in but should help tremendously. Finish the prednisone given by the emergency department. Follow up with me in 1 month for reevaluation - this will be important as we try to prevent these flares in the future.

## 2014-09-30 LAB — BODY FLUID CULTURE: Culture: NO GROWTH

## 2014-10-02 ENCOUNTER — Telehealth: Payer: Self-pay | Admitting: Family Medicine

## 2014-10-05 NOTE — Progress Notes (Signed)
PCP: No PCP Per Patient  Subjective:   HPI: Patient is a 49 y.o. male here for right knee effusion.  2/8: Patient has known history of gout. States over past week has had worsening pain, swelling in right knee. He had gout in his foot prior to this - took indomethacin and prednisone. Feels like a gout flare. No catching, locking, giving out. Remotely tore this ACL.  2/16: Patient reports he has improved since last week but pain still 6/10 and difficulty bearing weight. Swelling has improved but some still present.  4/18: Patient reports his right knee started to become sore again on Friday morning. Increased swelling as well - very severe by Saturday and came to ED. Had 35mL aspirated from his knee - bloody appearance to this. Denies known injury. Taking norco as needed.  8/23: Patient reports he came to ED recently for worsening pain in right knee and ankle. Pain level 10/10 with throbbing. Had 20mL aspirated from knee - surprisingly no crystals seen in ED fluid though 4649 WBCs. Consideration was given to rheumatology referral.  Past Medical History  Diagnosis Date  . Gout   . Knee effusion, left     Current Outpatient Prescriptions on File Prior to Visit  Medication Sig Dispense Refill  . allopurinol (ZYLOPRIM) 100 MG tablet Take 1 tablet (100 mg total) by mouth daily. 30 tablet 0  . Diphenhydramine-Acetaminophen (TYLENOL COLD RELIEF PO) Take 5 mLs by mouth every 4 (four) hours as needed. For cold    . hydrochlorothiazide (HYDRODIURIL) 25 MG tablet Take 1 tablet (25 mg total) by mouth daily. 30 tablet 0  . HYDROcodone-acetaminophen (NORCO) 5-325 MG per tablet Take 2 tablets by mouth every 4 (four) hours as needed. 20 tablet 0  . indomethacin (INDOCIN) 25 MG capsule Take 1 capsule (25 mg total) by mouth 3 (three) times daily as needed. 30 capsule 0  . predniSONE (DELTASONE) 10 MG tablet Take 2 tablets (20 mg total) by mouth 2 (two) times daily. 30 tablet 0   No  current facility-administered medications on file prior to visit.    Past Surgical History  Procedure Laterality Date  . Anterior cruciate ligament repair      No Known Allergies  Social History   Social History  . Marital Status: Married    Spouse Name: N/A  . Number of Children: N/A  . Years of Education: N/A   Occupational History  . Not on file.   Social History Main Topics  . Smoking status: Never Smoker   . Smokeless tobacco: Never Used  . Alcohol Use: 1.8 oz/week    3 Cans of beer per week     Comment: daily  . Drug Use: No  . Sexual Activity: Yes    Birth Control/ Protection: Condom   Other Topics Concern  . Not on file   Social History Narrative    No family history on file.  There were no vitals taken for this visit.  Review of Systems: See HPI above.    Objective:  Physical Exam:  Gen: NAD  Right knee: Large effusion.  No bruising, other deformity.  Warmth but no erythema. Diffuse anterior tenderness. ROM 0 - 90 degrees, slow and painful. Negative ant/post drawers. Negative valgus/varus testing. Negative lachmanns. Negative mcmurrays, apleys, patellar apprehension. NV intact distally.    Assessment & Plan:  1. Right knee effusion/pain - consistent with acute gout flare though fluid analysis on 8/20 showed no crystals surprisingly.  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  Encouraged to finish prednisone from the ED.  Aspirated and injected knee.  F/u in 1 month for reevaluation.  Hope to assess uric acid level and adjust allopurinol dosage accordingly.    After informed written consent patient was lying supine on exam table.  Right knee was prepped with alcohol swab.  Utilizing superolateral approach under ultrasound guidance, 95mL of yellow cloudy fluid was aspirated from right knee then right knee was injected with 3:1 marcaine:depomedrol.  Patient tolerated procedure well without immediate  complications

## 2014-10-05 NOTE — Telephone Encounter (Signed)
Spoke to patient and he stated that he did not need a note for work.

## 2014-10-05 NOTE — Telephone Encounter (Signed)
I'm ok with him having a work note for those days.  The only things we could consider for his ankle are a different anti-inflammatory (diclofenac) plus colchicine (this can be pretty expensive though).  As I discussed with him a cortisone shot is also an option directly into the ankle.

## 2014-10-05 NOTE — Assessment & Plan Note (Signed)
consistent with acute gout flare though fluid analysis on 8/20 showed no crystals surprisingly.  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  Encouraged to finish prednisone from the ED.  Aspirated and injected knee.  F/u in 1 month for reevaluation.  Hope to assess uric acid level and adjust allopurinol dosage accordingly.    After informed written consent patient was lying supine on exam table.  Right knee was prepped with alcohol swab.  Utilizing superolateral approach under ultrasound guidance, 95mL of yellow cloudy fluid was aspirated from right knee then right knee was injected with 3:1 marcaine:depomedrol.  Patient tolerated procedure well without immediate complications

## 2014-10-30 ENCOUNTER — Encounter: Payer: Self-pay | Admitting: Family Medicine

## 2014-10-30 ENCOUNTER — Other Ambulatory Visit: Payer: Self-pay | Admitting: Family Medicine

## 2014-10-30 ENCOUNTER — Ambulatory Visit (INDEPENDENT_AMBULATORY_CARE_PROVIDER_SITE_OTHER): Payer: Self-pay | Admitting: Family Medicine

## 2014-10-30 VITALS — BP 164/112 | HR 92 | Ht 69.0 in | Wt 195.0 lb

## 2014-10-30 DIAGNOSIS — M25461 Effusion, right knee: Secondary | ICD-10-CM

## 2014-10-30 DIAGNOSIS — M25462 Effusion, left knee: Secondary | ICD-10-CM

## 2014-10-30 MED ORDER — METHYLPREDNISOLONE ACETATE 40 MG/ML IJ SUSP
40.0000 mg | Freq: Once | INTRAMUSCULAR | Status: AC
  Administered 2014-10-30: 40 mg via INTRA_ARTICULAR

## 2014-10-30 MED ORDER — DICLOFENAC SODIUM 75 MG PO TBEC: 75.0000 mg | DELAYED_RELEASE_TABLET | Freq: Two times a day (BID) | ORAL | Status: DC

## 2014-10-30 NOTE — Patient Instructions (Signed)
Call me in a week to let me know how you're doing. Get the cone coverage in the meantime so we could do bloodwork, MRI if you're still not improving.

## 2014-10-31 LAB — SYNOVIAL CELL COUNT + DIFF, W/ CRYSTALS
Eosinophils-Synovial: 0 % (ref 0–1)
Lymphocytes-Synovial Fld: 11 % (ref 0–20)
Monocyte/Macrophage: 8 % — ABNORMAL LOW (ref 50–90)
Neutrophil, Synovial: 81 % — ABNORMAL HIGH (ref 0–25)
WBC, Synovial: 720 cu mm — ABNORMAL HIGH (ref 0–200)

## 2014-11-04 NOTE — Progress Notes (Signed)
PCP: No PCP Per Patient  Subjective:   HPI: Patient is a 49 y.o. male here for right knee effusion.  2/8: Patient has known history of gout. States over past week has had worsening pain, swelling in right knee. He had gout in his foot prior to this - took indomethacin and prednisone. Feels like a gout flare. No catching, locking, giving out. Remotely tore this ACL.  2/16: Patient reports he has improved since last week but pain still 6/10 and difficulty bearing weight. Swelling has improved but some still present.  4/18: Patient reports his right knee started to become sore again on Friday morning. Increased swelling as well - very severe by Saturday and came to ED. Had 35mL aspirated from his knee - bloody appearance to this. Denies known injury. Taking norco as needed.  8/23: Patient reports he came to ED recently for worsening pain in right knee and ankle. Pain level 10/10 with throbbing. Had 20mL aspirated from knee - surprisingly no crystals seen in ED fluid though 4649 WBCs. Consideration was given to rheumatology referral.  9/23: Patient returns with continued soreness in his right knee, 8/10 level Warmth. Difficulty bearing weight. Transient improvement after last visit.  Past Medical History  Diagnosis Date  . Gout   . Knee effusion, left     Current Outpatient Prescriptions on File Prior to Visit  Medication Sig Dispense Refill  . allopurinol (ZYLOPRIM) 100 MG tablet Take 1 tablet (100 mg total) by mouth daily. 30 tablet 0  . Diphenhydramine-Acetaminophen (TYLENOL COLD RELIEF PO) Take 5 mLs by mouth every 4 (four) hours as needed. For cold    . hydrochlorothiazide (HYDRODIURIL) 25 MG tablet Take 1 tablet (25 mg total) by mouth daily. 30 tablet 0  . HYDROcodone-acetaminophen (NORCO) 5-325 MG per tablet Take 2 tablets by mouth every 4 (four) hours as needed. 20 tablet 0  . predniSONE (DELTASONE) 10 MG tablet Take 2 tablets (20 mg total) by mouth 2 (two) times  daily. 30 tablet 0   No current facility-administered medications on file prior to visit.    Past Surgical History  Procedure Laterality Date  . Anterior cruciate ligament repair      No Known Allergies  Social History   Social History  . Marital Status: Married    Spouse Name: N/A  . Number of Children: N/A  . Years of Education: N/A   Occupational History  . Not on file.   Social History Main Topics  . Smoking status: Never Smoker   . Smokeless tobacco: Never Used  . Alcohol Use: 1.8 oz/week    3 Cans of beer per week     Comment: daily  . Drug Use: No  . Sexual Activity: Yes    Birth Control/ Protection: Condom   Other Topics Concern  . Not on file   Social History Narrative    No family history on file.  BP 164/112 mmHg  Pulse 92  Ht  (1.753 m)  Wt 195 lb (88.451 kg)  BMI 28.78 kg/m2  Review of Systems: See HPI above.    Objective:  Physical Exam:  Gen: NAD  Right knee: Large effusion.  No bruising, other deformity.  Warmth but no erythema. Diffuse anterior tenderness. ROM 0 - 90 degrees, slow and painful. Negative ant/post drawers. Negative valgus/varus testing. Negative lachmanns. Negative mcmurrays, apleys, patellar apprehension. NV intact distally.    Assessment & Plan:  1. Right knee effusion/pain - again consistent with gout flare though most recent  fluid analysis on 8/20 showed no crystals surprisingly.  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  He is s/p prednisone, cortisone injection.  Discussed options - he does not have any insurance coverage currently - was encouraged to get cone coverage so we could pursue bloodwork, MRI if he continues to struggle.  He would like to try aspiration/injection again.  Went ahead with this today.  We discussed increased risk of infection with repeated attempts at this.    After informed written consent patient was lying supine on exam table.  Right  knee was prepped with alcohol swab.  Utilizing superolateral approach under ultrasound guidance, 56mL of clear yellow fluid was aspirated from right knee then right knee was injected with 3:1 marcaine:depomedrol.  Patient tolerated procedure well without immediate complications

## 2014-11-04 NOTE — Assessment & Plan Note (Signed)
again consistent with gout flare though most recent fluid analysis on 8/20 showed no crystals surprisingly.  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  He is s/p prednisone, cortisone injection.  Discussed options - he does not have any insurance coverage currently - was encouraged to get cone coverage so we could pursue bloodwork, MRI if he continues to struggle.  He would like to try aspiration/injection again.  Went ahead with this today.  We discussed increased risk of infection with repeated attempts at this.    After informed written consent patient was lying supine on exam table.  Right knee was prepped with alcohol swab.  Utilizing superolateral approach under ultrasound guidance, 56mL of clear yellow fluid was aspirated from right knee then right knee was injected with 3:1 marcaine:depomedrol.  Patient tolerated procedure well without immediate complications

## 2014-12-29 ENCOUNTER — Ambulatory Visit (INDEPENDENT_AMBULATORY_CARE_PROVIDER_SITE_OTHER): Payer: Self-pay | Admitting: Family Medicine

## 2014-12-29 ENCOUNTER — Encounter: Payer: Self-pay | Admitting: Family Medicine

## 2014-12-29 VITALS — BP 180/116 | HR 101 | Ht 69.0 in | Wt 190.0 lb

## 2014-12-29 DIAGNOSIS — M25461 Effusion, right knee: Secondary | ICD-10-CM

## 2014-12-29 MED ORDER — METHYLPREDNISOLONE ACETATE 40 MG/ML IJ SUSP
40.0000 mg | Freq: Once | INTRAMUSCULAR | Status: AC
  Administered 2014-12-29: 40 mg via INTRA_ARTICULAR

## 2015-01-04 NOTE — Assessment & Plan Note (Signed)
new noncontact injury - possible he has torn ACL or meniscus though he still has not gotten cone coverage to pursue advanced imaging or referrals.  Discussed options and went ahead with repeat aspiration and injection.  We discussed increased risk of infection with repeated attempts at this.    After informed written consent patient was lying supine on exam table.  Right knee was prepped with alcohol swab.  Utilizing superolateral approach under ultrasound guidance, 65mL of red fluid was aspirated from right knee then right knee was injected with 3:1 marcaine:depomedrol.  Patient tolerated procedure well without immediate complications

## 2015-01-04 NOTE — Progress Notes (Signed)
PCP: No PCP Per Patient  Subjective:   HPI: Patient is a 49 y.o. male here for right knee effusion.  2/8: Patient has known history of gout. States over past week has had worsening pain, swelling in right knee. He had gout in his foot prior to this - took indomethacin and prednisone. Feels like a gout flare. No catching, locking, giving out. Remotely tore this ACL.  2/16: Patient reports he has improved since last week but pain still 6/10 and difficulty bearing weight. Swelling has improved but some still present.  4/18: Patient reports his right knee started to become sore again on Friday morning. Increased swelling as well - very severe by Saturday and came to ED. Had 35mL aspirated from his knee - bloody appearance to this. Denies known injury. Taking norco as needed.  8/23: Patient reports he came to ED recently for worsening pain in right knee and ankle. Pain level 10/10 with throbbing. Had 20mL aspirated from knee - surprisingly no crystals seen in ED fluid though 4649 WBCs. Consideration was given to rheumatology referral.  9/23: Patient returns with continued soreness in his right knee, 8/10 level Warmth. Difficulty bearing weight. Transient improvement after last visit.  11/22: Patient reports he slipped on steps Saturday and knee buckled, swelling returned. Pain level 10/10, sharp anterior knee. Difficulty bearing weight.  Has not put in to get cone coverage to date. Using crutches. No skin changes, fever, other complaints.  Past Medical History  Diagnosis Date  . Gout   . Knee effusion, left     Current Outpatient Prescriptions on File Prior to Visit  Medication Sig Dispense Refill  . allopurinol (ZYLOPRIM) 100 MG tablet Take 1 tablet (100 mg total) by mouth daily. 30 tablet 0  . diclofenac (VOLTAREN) 75 MG EC tablet Take 1 tablet (75 mg total) by mouth 2 (two) times daily. 60 tablet 1  . Diphenhydramine-Acetaminophen (TYLENOL COLD RELIEF PO) Take 5  mLs by mouth every 4 (four) hours as needed. For cold    . hydrochlorothiazide (HYDRODIURIL) 25 MG tablet Take 1 tablet (25 mg total) by mouth daily. 30 tablet 0  . HYDROcodone-acetaminophen (NORCO) 5-325 MG per tablet Take 2 tablets by mouth every 4 (four) hours as needed. 20 tablet 0  . predniSONE (DELTASONE) 10 MG tablet Take 2 tablets (20 mg total) by mouth 2 (two) times daily. 30 tablet 0   No current facility-administered medications on file prior to visit.    Past Surgical History  Procedure Laterality Date  . Anterior cruciate ligament repair      No Known Allergies  Social History   Social History  . Marital Status: Married    Spouse Name: N/A  . Number of Children: N/A  . Years of Education: N/A   Occupational History  . Not on file.   Social History Main Topics  . Smoking status: Never Smoker   . Smokeless tobacco: Never Used  . Alcohol Use: 1.8 oz/week    3 Cans of beer per week     Comment: daily  . Drug Use: No  . Sexual Activity: Yes    Birth Control/ Protection: Condom   Other Topics Concern  . Not on file   Social History Narrative    No family history on file.  BP 180/116 mmHg  Pulse 101  Ht  (1.753 m)  Wt 190 lb (86.183 kg)  BMI 28.05 kg/m2  Review of Systems: See HPI above.    Objective:  Physical Exam:  Gen: NAD  Right knee: Large effusion.  Quad atrophy.  No bruising, other deformity.  Warmth but no erythema. Diffuse anterior tenderness and guarding during exam. ROM 10 - 80 degrees, slow and painful. Unable to get accurate ant/post drawers.  Guarding with lachmanns as well Pain with apleys, negative patellar apprehension. NV intact distally.  Left knee: FROM without pain.    Assessment & Plan:  1. Right knee effusion/pain - new noncontact injury - possible he has torn ACL or meniscus though he still has not gotten cone coverage to pursue advanced imaging or referrals.  Discussed options and went ahead with repeat  aspiration and injection.  We discussed increased risk of infection with repeated attempts at this.    After informed written consent patient was lying supine on exam table.  Right knee was prepped with alcohol swab.  Utilizing superolateral approach under ultrasound guidance, 65mL of red fluid was aspirated from right knee then right knee was injected with 3:1 marcaine:depomedrol.  Patient tolerated procedure well without immediate complications

## 2015-03-07 ENCOUNTER — Emergency Department (HOSPITAL_BASED_OUTPATIENT_CLINIC_OR_DEPARTMENT_OTHER)
Admission: EM | Admit: 2015-03-07 | Discharge: 2015-03-07 | Disposition: A | Discharge status: Home / Self Care | Payer: Self-pay | Attending: Emergency Medicine | Admitting: Emergency Medicine

## 2015-03-07 ENCOUNTER — Encounter (HOSPITAL_BASED_OUTPATIENT_CLINIC_OR_DEPARTMENT_OTHER): Payer: Self-pay | Admitting: Emergency Medicine

## 2015-03-07 DIAGNOSIS — R030 Elevated blood-pressure reading, without diagnosis of hypertension: Secondary | ICD-10-CM | POA: Insufficient documentation

## 2015-03-07 DIAGNOSIS — M109 Gout, unspecified: Secondary | ICD-10-CM | POA: Insufficient documentation

## 2015-03-07 DIAGNOSIS — Z791 Long term (current) use of non-steroidal anti-inflammatories (NSAID): Secondary | ICD-10-CM | POA: Insufficient documentation

## 2015-03-07 DIAGNOSIS — L309 Dermatitis, unspecified: Secondary | ICD-10-CM | POA: Insufficient documentation

## 2015-03-07 DIAGNOSIS — Z7952 Long term (current) use of systemic steroids: Secondary | ICD-10-CM | POA: Insufficient documentation

## 2015-03-07 DIAGNOSIS — Z79899 Other long term (current) drug therapy: Secondary | ICD-10-CM | POA: Insufficient documentation

## 2015-03-07 DIAGNOSIS — Z202 Contact with and (suspected) exposure to infections with a predominantly sexual mode of transmission: Secondary | ICD-10-CM | POA: Insufficient documentation

## 2015-03-07 LAB — URINALYSIS, ROUTINE W REFLEX MICROSCOPIC
Bilirubin Urine: NEGATIVE
GLUCOSE, UA: NEGATIVE mg/dL
Hgb urine dipstick: NEGATIVE
KETONES UR: NEGATIVE mg/dL
LEUKOCYTES UA: NEGATIVE
Nitrite: NEGATIVE
PH: 6 (ref 5.0–8.0)
Protein, ur: NEGATIVE mg/dL
Specific Gravity, Urine: 1.005 (ref 1.005–1.030)

## 2015-03-07 MED ORDER — AMLODIPINE BESYLATE 10 MG PO TABS: 10.0000 mg | ORAL_TABLET | Freq: Every day | ORAL | Status: DC

## 2015-03-07 MED ORDER — HYDROCHLOROTHIAZIDE 25 MG PO TABS: 25.0000 mg | ORAL_TABLET | Freq: Every day | ORAL | Status: DC

## 2015-03-07 NOTE — ED Notes (Signed)
[place], [place] spoke to patient about ongoing HTN problem. Pt states he has a PCP that he follows up with. Pt reports he is not on any antihypertensive medications. [facility] [place], [place], to prescribe medication. Pt instructed to return back to [facility] in the morning to obtain medication at a reasonable rate. Informed patient that he may also utilize [facility] if we are too expensive. States understanding but reports obtaining medications at our pharmacy in the past.

## 2015-03-07 NOTE — ED Notes (Signed)
Pt states rash on genitals for past week.  Itching and painful.

## 2015-03-07 NOTE — ED Provider Notes (Signed)
CSN: [id number]     Arrival date & time 03/07/15  1342 History   First MD Initiated Contact with Patient 03/07/15 1520     Chief Complaint  Patient presents with  . Exposure to STD  . rash to groin      (Consider location/radiation/quality/duration/timing/severity/associated sxs/prior Treatment) HPI Lawrence [name] is a 50 y.o. male who comes in for evaluation of penile rash. Patient reports since Sunday he has noticed a rash on the tip of his penis. He reports a raw sensation and is worsened by rubbing on his boxers. Has been trying OTC creams and lotions without relief. He denies any discharge, abdominal pain, scrotal pain, testicular pain, rectal pain or painful bowel movements. No fevers or chills. Denies any new sexual contacts, patient is in monogamous relationship with his wife. Denies any concern for STI. No other modifying factors.  Past Medical History  Diagnosis Date  . Gout   . Knee effusion, left    Past Surgical History  Procedure Laterality Date  . Anterior cruciate ligament repair     No family history on file. Social History  Substance Use Topics  . Smoking status: Never Smoker   . Smokeless tobacco: Never Used  . Alcohol Use: 1.8 oz/week    3 Cans of beer per week     Comment: daily    Review of Systems A 10 point review of systems was completed and was negative except for pertinent positives and negatives as mentioned in the history of present illness     Allergies  Review of patient's allergies indicates no known allergies.  Home Medications   Prior to Admission medications   Medication Sig Start Date End Date Taking? Authorizing Provider  allopurinol (ZYLOPRIM) 100 MG tablet Take 1 tablet (100 mg total) by mouth daily. 06/05/13   Kristen N Ward, DO  amLODipine (NORVASC) 10 MG tablet Take 1 tablet (10 mg total) by mouth daily. 03/07/15   Joycie Peek, PA-C  diclofenac (VOLTAREN) 75 MG EC tablet Take 1 tablet (75 mg total) by mouth 2 (two) times  daily. 10/30/14   Lenda Kelp, MD  Diphenhydramine-Acetaminophen (TYLENOL COLD RELIEF PO) Take 5 mLs by mouth every 4 (four) hours as needed. For cold    Historical Provider, MD  hydrochlorothiazide (HYDRODIURIL) 25 MG tablet Take 1 tablet (25 mg total) by mouth daily. 03/07/15   Joycie Peek, PA-C  HYDROcodone-acetaminophen (NORCO) 5-325 MG per tablet Take 2 tablets by mouth every 4 (four) hours as needed. 09/26/14   Geoffery Lyons, MD  predniSONE (DELTASONE) 10 MG tablet Take 2 tablets (20 mg total) by mouth 2 (two) times daily. 09/26/14   Geoffery Lyons, MD   BP 183/104 mmHg  Pulse 86  Temp(Src) 98.3 F (36.8 C) (Oral)  Resp 18  Ht  (1.753 m)  Wt 88.451 kg  BMI 28.78 kg/m2  SpO2 99% Physical Exam  Constitutional: He is oriented to person, place, and time. He appears well-developed and well-nourished.  HENT:  Head: Normocephalic and atraumatic.  Mouth/Throat: Oropharynx is clear and moist.  Eyes: Conjunctivae are normal. Pupils are equal, round, and reactive to light. Right eye exhibits no discharge. Left eye exhibits no discharge. No scleral icterus.  Neck: Neck supple.  Cardiovascular: Normal rate, regular rhythm and normal heart sounds.   Pulmonary/Chest: Effort normal and breath sounds normal. No respiratory distress. He has no wheezes. He has no rales.  Abdominal: Soft. There is no tenderness.  Genitourinary:  Circumcised. Mild skin breakdown to glans.  No other discharge, lesions or deformities noted. Otherwise normal male GU exam  Musculoskeletal: He exhibits no tenderness.  Neurological: He is alert and oriented to person, place, and time.  Cranial Nerves II-XII grossly intact  Skin: Skin is warm and dry. No rash noted.  Psychiatric: He has a normal mood and affect.  Nursing note and vitals reviewed.   ED Course  Procedures (including critical care time) Labs Review Labs Reviewed  URINALYSIS, ROUTINE W REFLEX MICROSCOPIC (NOT AT [facility])  HIV ANTIBODY (ROUTINE  TESTING)  RPR    Imaging Review No results found. I have personally reviewed and evaluated these images and lab results as part of my medical decision-making.   EKG Interpretation None     Meds given in ED:  Medications - No data to display  Discharge Medication List as of 03/07/2015  3:51 PM     Filed Vitals:   03/07/15 1404 03/07/15 1550 03/07/15 1610  BP: 177/111 193/113 183/104  Pulse: 76 86   Temp: 98.3 F (36.8 C)    TempSrc: Oral    Resp: 18 18   Height:  (1.753 m)    Weight: 88.451 kg    SpO2: 100% 99%     MDM  Patient has not followed up with his primary care in some time, is not taking anything for his high blood pressure. Will initiate HCTZ and amlodipine and discussed will need to see his PCP or use resource guide to follow up with PCP. Penile rash seems to be consistent with a dermatitis. Not consistent with chancre, ulcerations. No evidence of STI, no exposure to STI. Discussed symptomatic care home and follow up with PCP, switching to cotton briefs instead of boxers. The patient appears reasonably screened and/or stabilized for discharge and I doubt any other medical condition or other [facility] requiring further screening, evaluation, or treatment in the ED at this time prior to discharge.   Final diagnoses:  Dermatitis        Joycie Peek, PA-C 03/07/15 2148  Jerelyn Scott, MD 03/07/15 2201

## 2015-03-07 NOTE — Discharge Instructions (Signed)
There does not appear to be an emergent cause for your rash at this time. He may continue using Neosporin.  Try using briefs instead of boxers to help with the irritation. Follow-up with your doctor or use the attached resource guide to help find a primary care doctor to be reevaluated next week. Return to ED for any new or worsening symptoms as we discussed.  Rash A rash is a change in the color or texture of the skin. There are many different types of rashes. You may have other problems that accompany your rash. CAUSES   Infections.  Allergic reactions. This can include allergies to pets or foods.  Certain medicines.  Exposure to certain chemicals, soaps, or cosmetics.  Heat.  Exposure to poisonous plants.  Tumors, both cancerous and noncancerous. SYMPTOMS   Redness.  Scaly skin.  Itchy skin.  Dry or cracked skin.  Bumps.  Blisters.  Pain. DIAGNOSIS  Your caregiver may do a physical exam to determine what type of rash you have. A skin sample (biopsy) may be taken and examined under a microscope. TREATMENT  Treatment depends on the type of rash you have. Your caregiver may prescribe certain medicines. For serious conditions, you may need to see a skin doctor (dermatologist). HOME CARE INSTRUCTIONS   Avoid the substance that caused your rash.  Do not scratch your rash. This can cause infection.  You may take cool baths to help stop itching.  Only take over-the-counter or prescription medicines as directed by your caregiver.  Keep all follow-up appointments as directed by your caregiver. SEEK IMMEDIATE MEDICAL CARE IF:  You have increasing pain, swelling, or redness.  You have a fever.  You have new or severe symptoms.  You have body aches, diarrhea, or vomiting.  Your rash is not better after 3 days. MAKE SURE YOU:  Understand these instructions.  Will watch your condition.  Will get help right away if you are not doing well or get worse.   This  information is not intended to replace advice given to you by your health care provider. Make sure you discuss any questions you have with your health care provider.   Document Released: 01/13/2002 Document Revised: 02/13/2014 Document Reviewed: 06/10/2014 Elsevier Interactive Patient Education [employer]

## 2015-03-08 LAB — HIV ANTIBODY (ROUTINE TESTING W REFLEX): HIV Screen 4th Generation wRfx: NONREACTIVE

## 2015-03-08 LAB — RPR: RPR: NONREACTIVE

## 2015-04-09 ENCOUNTER — Telehealth: Payer: Self-pay | Admitting: Family Medicine

## 2015-04-09 NOTE — Telephone Encounter (Signed)
We never saw him for his hand I believe.  The last note we gave him said he was back to full duty without restrictions in November (also the last time we saw him).

## 2015-04-12 ENCOUNTER — Encounter (HOSPITAL_BASED_OUTPATIENT_CLINIC_OR_DEPARTMENT_OTHER): Payer: Self-pay

## 2015-04-12 ENCOUNTER — Emergency Department (HOSPITAL_BASED_OUTPATIENT_CLINIC_OR_DEPARTMENT_OTHER)
Admission: EM | Admit: 2015-04-12 | Discharge: 2015-04-12 | Disposition: A | Discharge status: Home / Self Care | Payer: Self-pay | Attending: Emergency Medicine | Admitting: Emergency Medicine

## 2015-04-12 DIAGNOSIS — M10031 Idiopathic gout, right wrist: Secondary | ICD-10-CM | POA: Insufficient documentation

## 2015-04-12 DIAGNOSIS — M109 Gout, unspecified: Secondary | ICD-10-CM

## 2015-04-12 DIAGNOSIS — Z791 Long term (current) use of non-steroidal anti-inflammatories (NSAID): Secondary | ICD-10-CM | POA: Insufficient documentation

## 2015-04-12 DIAGNOSIS — Z7952 Long term (current) use of systemic steroids: Secondary | ICD-10-CM | POA: Insufficient documentation

## 2015-04-12 DIAGNOSIS — Z79899 Other long term (current) drug therapy: Secondary | ICD-10-CM | POA: Insufficient documentation

## 2015-04-12 NOTE — ED Provider Notes (Signed)
CSN: [id number]     Arrival date & time 04/12/15  1152 History   First MD Initiated Contact with Patient 04/12/15 1308     Chief Complaint  Patient presents with  . Hand Pain   Patient is a 50 y.o. male presenting with wrist pain.  Wrist Pain This is a recurrent problem. The current episode started in the past 7 days. Progression since onset: Improved. Associated symptoms include joint swelling. Pertinent negatives include no chills, fever, numbness or rash. Nothing aggravates the symptoms. He has tried NSAIDs for the symptoms. The treatment provided significant relief.   Lawrence [name] is a 50 year old male with PMHx of gout presenting with right hand pain. He reports onset of his typical gout symptoms 1 week ago. He states he had swelling and pain in the right wrist. He did not seek medical care because "I knew it was the gout so I just treated it with leftover indomethacin so I didn't have to come here". He reports almost full resolution of his symptoms currently. He states he has a small amount of hand swelling but the pain has resolved. He attempted to return to work today but his employer has requested a note stating he is healthy to work. He has no acute complaints today. He does not regularly take his allopurinol. He denies joint pain, redness, warmth or limited ROM. Denies systemic symptoms including fever, chills, nausea or vomiting. His PCP is Dr. Orvan Falconer.   Past Medical History  Diagnosis Date  . Gout   . Knee effusion, left    Past Surgical History  Procedure Laterality Date  . Anterior cruciate ligament repair     No family history on file. Social History  Substance Use Topics  . Smoking status: Never Smoker   . Smokeless tobacco: Never Used  . Alcohol Use: 1.8 oz/week    3 Cans of beer per week     Comment: daily    Review of Systems  Constitutional: Negative for fever and chills.  Musculoskeletal: Positive for joint swelling.  Skin: Negative for color change and rash.   Neurological: Negative for numbness.  All other systems reviewed and are negative.     Allergies  Review of patient's allergies indicates no known allergies.  Home Medications   Prior to Admission medications   Medication Sig Start Date End Date Taking? Authorizing Provider  indomethacin (INDOCIN) 25 MG capsule Take 25 mg by mouth 2 (two) times daily with a meal.   Yes Historical Provider, MD  allopurinol (ZYLOPRIM) 100 MG tablet Take 1 tablet (100 mg total) by mouth daily. 06/05/13   Kristen N Ward, DO  amLODipine (NORVASC) 10 MG tablet Take 1 tablet (10 mg total) by mouth daily. 03/07/15   Joycie Peek, PA-C  diclofenac (VOLTAREN) 75 MG EC tablet Take 1 tablet (75 mg total) by mouth 2 (two) times daily. 10/30/14   Lenda Kelp, MD  Diphenhydramine-Acetaminophen (TYLENOL COLD RELIEF PO) Take 5 mLs by mouth every 4 (four) hours as needed. For cold    Historical Provider, MD  hydrochlorothiazide (HYDRODIURIL) 25 MG tablet Take 1 tablet (25 mg total) by mouth daily. 03/07/15   Joycie Peek, PA-C  HYDROcodone-acetaminophen (NORCO) 5-325 MG per tablet Take 2 tablets by mouth every 4 (four) hours as needed. 09/26/14   Geoffery Lyons, MD  predniSONE (DELTASONE) 10 MG tablet Take 2 tablets (20 mg total) by mouth 2 (two) times daily. 09/26/14   Geoffery Lyons, MD   BP 161/99 mmHg  Pulse 90  Temp(Src) 98.3 F (36.8 C) (Oral)  Resp 18  SpO2 97% Physical Exam  Constitutional: He appears well-developed and well-nourished. No distress.  Nontoxic appearing  HENT:  Head: Normocephalic and atraumatic.  Right Ear: External ear normal.  Left Ear: External ear normal.  Eyes: Conjunctivae are normal. Right eye exhibits no discharge. Left eye exhibits no discharge. No scleral icterus.  Neck: Normal range of motion.  Cardiovascular: Normal rate.   Pulmonary/Chest: Effort normal.  Musculoskeletal: Normal range of motion.       Right wrist: Normal. He exhibits normal range of motion, no  tenderness, no swelling, no effusion and no deformity.  Right wrist is not tender to palpation. FROM intact of the elbow, wrist and digits. No obvious swelling or deformity. Pt is able to make a tight fist. No warmth or overlying erythema of the joint. Moves remaining extremities spontaneously. Walks with a steady gait.   Neurological: He is alert. Coordination normal.  Skin: Skin is warm and dry. No erythema.  Psychiatric: He has a normal mood and affect. His behavior is normal.  Nursing note and vitals reviewed.   ED Course  Procedures (including critical care time) Labs Review Labs Reviewed - No data to display  Imaging Review No results found. I have personally reviewed and evaluated these images and lab results as part of my medical decision-making.   EKG Interpretation None      MDM   Final diagnoses:  Acute gout of right wrist, unspecified cause   50 year old male with PMHx of gout presenting with resolved gout episode. Pt self treated with indomethacin which he reports resolved his symptoms. He states he needs a work note clearing him for return. VSS. Pt is nontoxic appearing. FROM of the right wrist and digits. No tenderness over the hand. No obvious swelling. No erythema or warmth. Doubt septic joint given lack of systemic symptoms, swelling, pain or erythema. He states he does not need an Xray and his hand feels like it is back to his baseline. Admits some noncompliance with his allopurinol. Discussed importance of taking his medication daily. Pt is to follow up with his PCP as needed. I feel that he can go to work given his clinical improvement. Return precautions given in discharge paperwork and discussed with pt at bedside. Pt stable for discharge     Lawrence HeimlichStevi Caelie Remsburg, PA-C 04/12/15 1402  Lawrence OctaveStephen Rancour, MD 04/12/15 1511

## 2015-04-12 NOTE — Discharge Instructions (Signed)

## 2015-04-12 NOTE — Telephone Encounter (Signed)
Spoke to patient and gave him information provided by the physician. Told patient that we could not write a letter since we have not seen him for his right hand.

## 2015-04-12 NOTE — ED Notes (Signed)
Pt reports gout to R hand, his boss told him he needed work note to clear for work.  Denies need for xray.

## 2015-07-25 ENCOUNTER — Encounter (HOSPITAL_BASED_OUTPATIENT_CLINIC_OR_DEPARTMENT_OTHER): Payer: Self-pay | Admitting: Emergency Medicine

## 2015-07-25 ENCOUNTER — Emergency Department (HOSPITAL_BASED_OUTPATIENT_CLINIC_OR_DEPARTMENT_OTHER)
Admission: EM | Admit: 2015-07-25 | Discharge: 2015-07-25 | Disposition: A | Discharge status: Home / Self Care | Payer: Self-pay | Attending: Emergency Medicine | Admitting: Emergency Medicine

## 2015-07-25 DIAGNOSIS — L309 Dermatitis, unspecified: Secondary | ICD-10-CM | POA: Insufficient documentation

## 2015-07-25 MED ORDER — DIPHENHYDRAMINE HCL 25 MG PO TABS: 50.0000 mg | ORAL_TABLET | Freq: Three times a day (TID) | ORAL | Status: DC | PRN

## 2015-07-25 MED ORDER — PREDNISONE 10 MG (21) PO TBPK: ORAL_TABLET | ORAL | Status: DC

## 2015-07-25 MED ORDER — DIPHENHYDRAMINE HCL 25 MG PO CAPS
25.0000 mg | ORAL_CAPSULE | Freq: Once | ORAL | Status: AC
  Administered 2015-07-25: 25 mg via ORAL
  Filled 2015-07-25: qty 1

## 2015-07-25 MED ORDER — PREDNISONE 50 MG PO TABS
60.0000 mg | ORAL_TABLET | Freq: Once | ORAL | Status: AC
  Administered 2015-07-25: 60 mg via ORAL
  Filled 2015-07-25: qty 1

## 2015-07-25 NOTE — ED Notes (Signed)
Patient states that he has had a rash to his arms and eyes x 1 week after working in the yard

## 2015-07-25 NOTE — ED Provider Notes (Signed)
CSN: [id number]     Arrival date & time 07/25/15  1514 History  By signing my name below, I, Marisue Humble, attest that this documentation has been prepared under the direction and in the presence of Linwood Dibbles, MD . Electronically Signed: Marisue Humble, Scribe. 07/25/2015. 4:30 PM.   Chief Complaint  Patient presents with  . Rash   The history is provided by the patient. No language interpreter was used.   HPI Comments:  Lawrence [name] is a 50 y.o. male with PMHx of gout who presents to the Emergency Department complaining of red, itchy rash on arms and eyes for the past week. Pt reports associated eye swelling, itching and redness. He states the rash began after he cut the grass ~1 week ago. He has used cocoa butter on eyes and calamine lotion on arms without relief. Denies shortness of breath, fever, vision changes, or throat swelling.  Past Medical History  Diagnosis Date  . Gout   . Knee effusion, left    Past Surgical History  Procedure Laterality Date  . Anterior cruciate ligament repair     History reviewed. No pertinent family history. Social History  Substance Use Topics  . Smoking status: Never Smoker   . Smokeless tobacco: Never Used  . Alcohol Use: 1.8 oz/week    3 Cans of beer per week     Comment: daily    Review of Systems  Constitutional: Negative for fever.  HENT: Positive for facial swelling (eyes). Negative for trouble swallowing.   Eyes: Positive for redness and itching. Negative for visual disturbance.  Respiratory: Negative for shortness of breath.   Skin: Positive for rash.  All other systems reviewed and are negative.   Allergies  Review of patient's allergies indicates no known allergies.  Home Medications   Prior to Admission medications   Medication Sig Start Date End Date Taking? Authorizing Provider  allopurinol (ZYLOPRIM) 100 MG tablet Take 1 tablet (100 mg total) by mouth daily. 06/05/13   Kristen N Ward, DO  amLODipine (NORVASC) 10  MG tablet Take 1 tablet (10 mg total) by mouth daily. 03/07/15   Joycie Peek, PA-C  diclofenac (VOLTAREN) 75 MG EC tablet Take 1 tablet (75 mg total) by mouth 2 (two) times daily. 10/30/14   Lenda Kelp, MD  Diphenhydramine-Acetaminophen (TYLENOL COLD RELIEF PO) Take 5 mLs by mouth every 4 (four) hours as needed. For cold    Historical Provider, MD  hydrochlorothiazide (HYDRODIURIL) 25 MG tablet Take 1 tablet (25 mg total) by mouth daily. 03/07/15   Joycie Peek, PA-C  HYDROcodone-acetaminophen (NORCO) 5-325 MG per tablet Take 2 tablets by mouth every 4 (four) hours as needed. 09/26/14   Geoffery Lyons, MD  indomethacin (INDOCIN) 25 MG capsule Take 25 mg by mouth 2 (two) times daily with a meal.    Historical Provider, MD  predniSONE (DELTASONE) 10 MG tablet Take 2 tablets (20 mg total) by mouth 2 (two) times daily. 09/26/14   Geoffery Lyons, MD   BP 170/109 mmHg  Pulse 106  Temp(Src) 99.2 F (37.3 C) (Oral)  Resp 18  Ht  (1.753 m)  Wt 195 lb (88.451 kg)  BMI 28.78 kg/m2  SpO2 100%   Physical Exam  Constitutional: He appears well-developed and well-nourished. No distress.  HENT:  Head: Normocephalic and atraumatic.  Right Ear: External ear normal.  Left Ear: External ear normal.  Mouth/Throat: No oropharyngeal exudate.  No edema  Eyes: Conjunctivae are normal. Right eye exhibits no discharge. Left  eye exhibits no discharge. No scleral icterus.  Neck: Neck supple. No tracheal deviation present.  Cardiovascular: Normal rate, regular rhythm and normal heart sounds.   Pulmonary/Chest: Effort normal and breath sounds normal. No stridor. No respiratory distress.  Musculoskeletal: He exhibits no edema.  Neurological: He is alert. Cranial nerve deficit: no gross deficits.  Skin: Skin is warm and dry. Rash noted. No purpura noted. Rash is maculopapular. Rash is not pustular and not vesicular.  Areas of erythema BL antecubital fossas, periorbital region and cheek; evidence of  scratching and excoriation   Psychiatric: He has a normal mood and affect.  Nursing note and vitals reviewed.   ED Course  Procedures  DIAGNOSTIC STUDIES:  Oxygen Saturation is 100% on RA, normal by my interpretation.    COORDINATION OF CARE:  4:26 PM Will start on steroid and antihistamine. Discussed treatment plan with pt at bedside and pt agreed to plan.    MDM   Final diagnoses:  None   Probable contact dermatitis vs allergic dermatitis.  No airway compromise.  Will dc home with steroids and antihistamines.   I personally performed the services described in this documentation, which was scribed in my presence.  The recorded information has been reviewed and is accurate.    Linwood DibblesJon Obdulia Steier, MD 07/25/15 [phone]

## 2015-07-25 NOTE — Discharge Instructions (Signed)

## 2015-11-18 ENCOUNTER — Encounter (HOSPITAL_BASED_OUTPATIENT_CLINIC_OR_DEPARTMENT_OTHER): Payer: Self-pay | Admitting: Emergency Medicine

## 2015-11-18 ENCOUNTER — Emergency Department (HOSPITAL_BASED_OUTPATIENT_CLINIC_OR_DEPARTMENT_OTHER): Payer: Self-pay

## 2015-11-18 ENCOUNTER — Emergency Department (HOSPITAL_BASED_OUTPATIENT_CLINIC_OR_DEPARTMENT_OTHER)
Admission: EM | Admit: 2015-11-18 | Discharge: 2015-11-18 | Disposition: A | Discharge status: Home / Self Care | Payer: Self-pay | Attending: Emergency Medicine | Admitting: Emergency Medicine

## 2015-11-18 DIAGNOSIS — M25471 Effusion, right ankle: Secondary | ICD-10-CM

## 2015-11-18 DIAGNOSIS — M25571 Pain in right ankle and joints of right foot: Secondary | ICD-10-CM

## 2015-11-18 DIAGNOSIS — Z79899 Other long term (current) drug therapy: Secondary | ICD-10-CM | POA: Insufficient documentation

## 2015-11-18 DIAGNOSIS — R600 Localized edema: Secondary | ICD-10-CM | POA: Insufficient documentation

## 2015-11-18 MED ORDER — NAPROXEN SODIUM 220 MG PO TABS: 220.0000 mg | ORAL_TABLET | Freq: Two times a day (BID) | ORAL | 0 refills | Status: DC

## 2015-11-18 MED ORDER — KETOROLAC TROMETHAMINE 60 MG/2ML IM SOLN
60.0000 mg | Freq: Once | INTRAMUSCULAR | Status: AC
  Administered 2015-11-18: 60 mg via INTRAMUSCULAR
  Filled 2015-11-18: qty 2

## 2015-11-18 MED ORDER — DEXAMETHASONE SODIUM PHOSPHATE 10 MG/ML IJ SOLN
10.0000 mg | Freq: Once | INTRAMUSCULAR | Status: AC
  Administered 2015-11-18: 10 mg via INTRAMUSCULAR
  Filled 2015-11-18: qty 1

## 2015-11-18 MED ORDER — DEXAMETHASONE SODIUM PHOSPHATE 10 MG/ML IJ SOLN: 10.0000 mg | Freq: Once | INTRAMUSCULAR | Status: DC

## 2015-11-18 MED ORDER — HYDROCODONE-ACETAMINOPHEN 5-325 MG PO TABS: 1.0000 | ORAL_TABLET | Freq: Three times a day (TID) | ORAL | 0 refills | Status: DC | PRN

## 2015-11-18 MED ORDER — CEPHALEXIN 250 MG PO CAPS
500.0000 mg | ORAL_CAPSULE | Freq: Once | ORAL | Status: AC
  Administered 2015-11-18: 500 mg via ORAL
  Filled 2015-11-18: qty 2

## 2015-11-18 MED ORDER — CEPHALEXIN 500 MG PO CAPS: 500.0000 mg | ORAL_CAPSULE | Freq: Four times a day (QID) | ORAL | 0 refills | Status: DC

## 2015-11-18 NOTE — ED Triage Notes (Signed)
Pt in c/o bilateral ankle swelling. States walks a lot for work. Denies injury. Pt alert, interactive, ambulatory in NAD.

## 2015-11-18 NOTE — ED Provider Notes (Signed)
[facility] Provider Note   CSN: [id number] Arrival date & time: 11/18/15  1918  By signing my name below, I, Nelwyn Salisbury, attest that this documentation has been prepared under the direction and in the presence of non-physician practitioner, Danelle Berry, PA-C. Electronically Signed: Nelwyn Salisbury, Scribe. 11/18/2015. 8:13 PM.  History   Chief Complaint Chief Complaint  Patient presents with  . Leg Swelling   The history is provided by the patient. No language interpreter was used.    HPI Comments:  Lawrence [name] is a 50 y.o. male with PMHx of Gout who presents to the Emergency Department complaining of gradual onset, intermittent and worsening right ankle pain and swelling the past 2-3 weeks. He notes that it swells throughout the day and usually improves somewhat at night when his legs are elevated. He describes his symptoms as a sharp throbbing pain, exacerbated by walking and standing, pain rated 8/10.  His right lower leg is also a little red however he cannot state when this began.  Pt has had pain in both of his ankles for a few weeks, but has had swelling present for the past week. The pt reports that he has had some joint problems in the past with his pmhx of gout, but these symptoms do not feel like his past symptoms. He denies any numbness, tingling, fevers, diaphoresis, chills, or weakness. He also denies any recent injury to his ankles.  Pt also reports that he has a bump on his right lower leg earlier this week, which he popped to find some purulent discharge. He states that the area is still a little tender, but improving.  No subsequent drainage or swelling.   Past Medical History:  Diagnosis Date  . Gout   . Knee effusion, left     Patient Active Problem List   Diagnosis Date Noted  . Effusion of right knee 03/18/2014    Past Surgical History:  Procedure Laterality Date  . ANTERIOR CRUCIATE LIGAMENT REPAIR      Home Medications    Prior to  Admission medications   Medication Sig Start Date End Date Taking? Authorizing Provider  allopurinol (ZYLOPRIM) 100 MG tablet Take 1 tablet (100 mg total) by mouth daily. 06/05/13   Kristen N Ward, DO  amLODipine (NORVASC) 10 MG tablet Take 1 tablet (10 mg total) by mouth daily. 03/07/15   Joycie Peek, PA-C  cephALEXin (KEFLEX) 500 MG capsule Take 1 capsule (500 mg total) by mouth 4 (four) times daily. 11/18/15   Danelle Berry, PA-C  diclofenac (VOLTAREN) 75 MG EC tablet Take 1 tablet (75 mg total) by mouth 2 (two) times daily. 10/30/14   Lenda Kelp, MD  diphenhydrAMINE (BENADRYL) 25 MG tablet Take 2 tablets (50 mg total) by mouth every 8 (eight) hours as needed for itching. 07/25/15   Linwood Dibbles, MD  Diphenhydramine-Acetaminophen (TYLENOL COLD RELIEF PO) Take 5 mLs by mouth every 4 (four) hours as needed. For cold    Historical Provider, MD  hydrochlorothiazide (HYDRODIURIL) 25 MG tablet Take 1 tablet (25 mg total) by mouth daily. 03/07/15   Joycie Peek, PA-C  HYDROcodone-acetaminophen (NORCO) 5-325 MG per tablet Take 2 tablets by mouth every 4 (four) hours as needed. 09/26/14   Geoffery Lyons, MD  HYDROcodone-acetaminophen (NORCO/VICODIN) 5-325 MG tablet Take 1 tablet by mouth every 8 (eight) hours as needed for severe pain. 11/18/15   Danelle Berry, PA-C  indomethacin (INDOCIN) 25 MG capsule Take 25 mg by mouth 2 (two) times daily with  a meal.    Historical Provider, MD  naproxen sodium (ALEVE) 220 MG tablet Take 1 tablet (220 mg total) by mouth 2 (two) times daily with a meal. 11/18/15   Danelle Berry, PA-C  predniSONE (STERAPRED UNI-PAK 21 TAB) 10 MG (21) TBPK tablet Take 6 tabs by mouth daily  for 2 days, then 5 tabs for 2 days, then 4 tabs for 2 days, then 3 tabs for 2 days, 2 tabs for 2 days, then 1 tab by mouth daily for 2 days 07/25/15   Linwood Dibbles, MD    Family History History reviewed. No pertinent family history.  Social History Social History  Substance Use Topics  . Smoking status:  Never Smoker  . Smokeless tobacco: Never Used  . Alcohol use 1.8 oz/week    3 Cans of beer per week     Comment: daily     Allergies   Review of patient's allergies indicates no known allergies.   Review of Systems Review of Systems  Neurological:       Negative for decreased sensation.  All other systems reviewed and are negative.    Physical Exam Updated Vital Signs BP (!) 188/121 (BP Location: Right Arm)   Pulse 84   Temp 98.3 F (36.8 C)   Resp 18   Ht 5\' 9"  (1.753 m)   Wt 88.5 kg   SpO2 99%   BMI 28.80 kg/m   Physical Exam  Constitutional: He is oriented to person, place, and time. He appears well-developed and well-nourished. No distress.  HENT:  Head: Normocephalic and atraumatic.  Right Ear: External ear normal.  Left Ear: External ear normal.  Nose: Nose normal.  Mouth/Throat: Oropharynx is clear and moist. No oropharyngeal exudate.  Eyes: Conjunctivae and EOM are normal. Pupils are equal, round, and reactive to light. Right eye exhibits no discharge. Left eye exhibits no discharge. No scleral icterus.  Neck: Normal range of motion. Neck supple. No JVD present. No tracheal deviation present.  Cardiovascular: Normal rate and regular rhythm.   Pulmonary/Chest: Effort normal and breath sounds normal. No stridor. No respiratory distress.  Abdominal: He exhibits no distension.  Musculoskeletal: Normal range of motion. He exhibits edema and tenderness. He exhibits no deformity.       Right knee: He exhibits normal range of motion, no swelling and no erythema.       Right ankle: He exhibits swelling. He exhibits no ecchymosis, no deformity, no laceration and normal pulse. Achilles tendon exhibits pain. Achilles tendon exhibits no defect and normal Thompson's test results.       Right foot: There is normal range of motion, no tenderness, no bony tenderness, no swelling and normal capillary refill.       Feet:  Right medial and lateral malleolus edema.  Posterior  to medial malleolus tenderness, no bony tenderness. 1+ right pretibial edema with generalized erythema to right lower extremity, no warmth. Right calf with 0.5 cm healing lesion, with surrounding dry and flaking skin that is hyperpigmentation, no surrounding induration, no fluctuance, no active drainage, nontender to palpation Right calf muscles nontender to palpation, no edema, no erythema Right knee midline surgical scar  Lymphadenopathy:    He has no cervical adenopathy.  Neurological: He is alert and oriented to person, place, and time. He exhibits normal muscle tone. Coordination normal.  Skin: Skin is warm and dry. Capillary refill takes less than 2 seconds. No rash noted. He is not diaphoretic. No erythema. No pallor.  Psychiatric: He has a  normal mood and affect. His behavior is normal. Judgment and thought content normal.  Nursing note and vitals reviewed.    ED Treatments / Results  DIAGNOSTIC STUDIES:  Oxygen Saturation is 95% on RA, adequate by my interpretation.    COORDINATION OF CARE:  9:00 PM Discussed treatment plan with pt at bedside which included imaging and pt agreed to plan.  Labs (all labs ordered are listed, but only abnormal results are displayed) Labs Reviewed - No data to display  EKG  EKG Interpretation None       Radiology Dg Ankle Complete Right  Result Date: 11/18/2015 CLINICAL DATA:  Right ankle pain, swelling, redness. Swelling for 1 week. EXAM: RIGHT ANKLE - COMPLETE 3+ VIEW COMPARISON:  None. FINDINGS: Diffuse soft tissue edema about the ankle. No evidence of joint effusion. No fracture, dislocation, or bony destructive change. Mild proliferative change at the talonavicular joint. IMPRESSION: Diffuse soft tissue edema without acute osseous abnormality. Degenerative change at the talonavicular joint. Electronically Signed   By: Rubye Oaks M.D.   On: 11/18/2015 21:35    Procedures Procedures (including critical care time)  Medications  Ordered in ED Medications  cephALEXin (KEFLEX) capsule 500 mg (500 mg Oral Given 11/18/15 2307)  ketorolac (TORADOL) injection 60 mg (60 mg Intramuscular Given 11/18/15 2308)  dexamethasone (DECADRON) injection 10 mg (10 mg Intramuscular Given 11/18/15 2308)     Initial Impression / Assessment and Plan / ED Course  I have reviewed the triage vital signs and the nursing notes.  Pertinent labs & imaging results that were available during my care of the patient were reviewed by me and considered in my medical decision making (see chart for details).  Clinical Course  50 year old male complains of bilateral lower extremity swelling over the past 2-3 weeks with improvement with elevation. He does have right ankle pain and swelling which is much more prominent when compared to left ankle, no history of injury.  On exam is tender just posterior to medial malleolus.  He has a small area on his right medial calf which she states was a "boil" which he "busted".  Additionally he has 1+ pretibial pitting edema and erythema. Calf is soft and nontender.  Extremities difficult to say whether the erythema secondary to peripheral edema or related to recent abscess which appears to be healing well without any induration or fluctuance.  X-ray significant for diffuse soft tissue edema of the right ankle with degenerative changes at the talonavicular joint.  This is consistent with his overall appearance of probable chronic right ankle swelling and pain. There is no erythema on the ankle itself.  Will place pt in ankle brace and referred to orthopedic evaluation.  Discussed case and findings with attending EDP who agrees to cover with Keflex for possible cellulitis however again this may be secondary to his peripheral edema.  Encouraged patient to follow-up PCP for recheck.  Return precautions reviewed, patient verbalizes understanding. He was discharged home in good condition. Encouraged to elevate legs, wear compression  stockings, take NSAIDs and pain medication as needed.  Patient was hypertensive, he was due for his home medication, also may be secondary to pain.  Encouraged to recheck with his PCP. He denies any chest pain, headache, shortness of breath, visual disturbances. No concern for end organ damage.  Discharged home in good condition.  Final Clinical Impressions(s) / ED Diagnoses   Final diagnoses:  Pain and swelling of right ankle  Leg edema    New Prescriptions Discharge Medication  List as of 11/18/2015 10:53 PM    START taking these medications   Details  cephALEXin (KEFLEX) 500 MG capsule Take 1 capsule (500 mg total) by mouth 4 (four) times daily., Starting Thu 11/18/2015, Print    !! HYDROcodone-acetaminophen (NORCO/VICODIN) 5-325 MG tablet Take 1 tablet by mouth every 8 (eight) hours as needed for severe pain., Starting Thu 11/18/2015, Print    naproxen sodium (ALEVE) 220 MG tablet Take 1 tablet (220 mg total) by mouth 2 (two) times daily with a meal., Starting Thu 11/18/2015, Print     !! - Potential duplicate medications found. Please discuss with provider.    I personally performed the services described in this documentation, which was scribed in my presence. The recorded information has been reviewed and is accurate.       Danelle BerryLeisa Brigitta Pricer, PA-C 11/19/15 0106    Canary Brimhristopher J Tegeler, MD 11/19/15 [phone]

## 2015-11-25 ENCOUNTER — Ambulatory Visit (INDEPENDENT_AMBULATORY_CARE_PROVIDER_SITE_OTHER): Payer: Self-pay | Admitting: Family Medicine

## 2015-11-25 ENCOUNTER — Encounter: Payer: Self-pay | Admitting: Family Medicine

## 2015-11-25 DIAGNOSIS — M25572 Pain in left ankle and joints of left foot: Secondary | ICD-10-CM

## 2015-11-25 DIAGNOSIS — M25571 Pain in right ankle and joints of right foot: Secondary | ICD-10-CM

## 2015-11-25 MED ORDER — TRAMADOL HCL 50 MG PO TABS: 50.0000 mg | ORAL_TABLET | Freq: Four times a day (QID) | ORAL | 0 refills | Status: DC | PRN

## 2015-11-25 MED ORDER — PREDNISONE 10 MG (21) PO TBPK: ORAL_TABLET | ORAL | 0 refills | Status: DC

## 2015-11-25 MED FILL — predniSONE 10 MG TABS: 10 | 10 days supply | Qty: 42 | Fill #0

## 2015-11-25 MED FILL — traMADol HCL 50 MG TABS: 50 | 10 days supply | Qty: 40 | Fill #0

## 2015-11-25 NOTE — Patient Instructions (Signed)
Take prednisone for 12 days as directed. Tramadol as needed for severe pain (no driving on this medicine). Follow up with me in 1 1/2 weeks for reevaluation. Consider ankle sleeves, ACE wraps to help with the swelling. Icing 15 minutes at a time 3-4 times a day. Elevation above your heart as well.

## 2015-11-29 DIAGNOSIS — M25572 Pain in left ankle and joints of left foot: Secondary | ICD-10-CM

## 2015-11-29 DIAGNOSIS — M25571 Pain in right ankle and joints of right foot: Secondary | ICD-10-CM | POA: Insufficient documentation

## 2015-11-29 NOTE — Assessment & Plan Note (Signed)
with diffuse swelling.  History of gout.  Effusion noted on MSK u/s bilaterally.  Consistent with acute gout flare.  Will start with 12 day steroid dose pack.  Tramadol as needed.  Discussed compression, icing, elevation.  Call us in 1-2 weeks for an update on his status.  Again encouraged to get [facility] Coverage.

## 2015-11-29 NOTE — Progress Notes (Signed)
PCP: Tarri FullerESCAJEDA, RICHARD, MD  Subjective:   HPI: Patient is a 50 y.o. male here for bilateral ankle pain.  Patient reports he's had about 1 month of bilateral ankle swelling and pain. Pain is sharp, 10/10 level. Taking tylenol and wearing inserts. Was given decadron and toradol injections, norco. No skin changes, fever, numbness. Has history of gout. Was given antibiotics as well which he took (keflex).  Past Medical History:  Diagnosis Date  . Gout   . Knee effusion, left     Current Outpatient Prescriptions on File Prior to Visit  Medication Sig Dispense Refill  . allopurinol (ZYLOPRIM) 100 MG tablet Take 1 tablet (100 mg total) by mouth daily. 30 tablet 0  . amLODipine (NORVASC) 10 MG tablet Take 1 tablet (10 mg total) by mouth daily. 30 tablet 0  . cephALEXin (KEFLEX) 500 MG capsule Take 1 capsule (500 mg total) by mouth 4 (four) times daily. 20 capsule 0  . diphenhydrAMINE (BENADRYL) 25 MG tablet Take 2 tablets (50 mg total) by mouth every 8 (eight) hours as needed for itching. 21 tablet 0  . Diphenhydramine-Acetaminophen (TYLENOL COLD RELIEF PO) Take 5 mLs by mouth every 4 (four) hours as needed. For cold    . hydrochlorothiazide (HYDRODIURIL) 25 MG tablet Take 1 tablet (25 mg total) by mouth daily. 30 tablet 0  . indomethacin (INDOCIN) 25 MG capsule Take 25 mg by mouth 2 (two) times daily with a meal.     No current facility-administered medications on file prior to visit.     Past Surgical History:  Procedure Laterality Date  . ANTERIOR CRUCIATE LIGAMENT REPAIR      No Known Allergies  Social History   Social History  . Marital status: Married    Spouse name: N/A  . Number of children: N/A  . Years of education: N/A   Occupational History  . Not on file.   Social History Main Topics  . Smoking status: Never Smoker  . Smokeless tobacco: Never Used  . Alcohol use 1.8 oz/week    3 Cans of beer per week     Comment: daily  . Drug use: No  . Sexual  activity: Yes    Birth control/ protection: Condom   Other Topics Concern  . Not on file   Social History Narrative  . No narrative on file    No family history on file.  BP (!) 143/94   Pulse 92   Ht 5\' 9"  (1.753 m)   Wt 205 lb (93 kg)   BMI 30.27 kg/m   Review of Systems: See HPI above.    Objective:  Physical Exam:  Gen: NAD, comfortable in exam room  Bilateral ankles: Diffuse swelling throughout.  No bruising, other deformity. Mod limitation all directions TTP throughout anterior ankle Negative ant drawer and talar tilt.   Negative syndesmotic compression. Thompsons test negative. NV intact distally.    Assessment & Plan:  1. Bilateral ankle pain - with diffuse swelling.  History of gout.  Effusion noted on MSK u/s bilaterally.  Consistent with acute gout flare.  Will start with 12 day steroid dose pack.  Tramadol as needed.  Discussed compression, icing, elevation.  Call us in 1-2 weeks for an update on his status.  Again encouraged to get [facility] Coverage.

## 2015-12-06 ENCOUNTER — Encounter: Payer: Self-pay | Admitting: Family Medicine

## 2015-12-06 ENCOUNTER — Ambulatory Visit (INDEPENDENT_AMBULATORY_CARE_PROVIDER_SITE_OTHER): Payer: Self-pay | Admitting: Family Medicine

## 2015-12-06 DIAGNOSIS — M25571 Pain in right ankle and joints of right foot: Secondary | ICD-10-CM

## 2015-12-06 DIAGNOSIS — M25572 Pain in left ankle and joints of left foot: Secondary | ICD-10-CM

## 2015-12-06 DIAGNOSIS — G8929 Other chronic pain: Secondary | ICD-10-CM

## 2015-12-06 NOTE — Patient Instructions (Signed)
Take aleve 2 tablets twice a day with food (of the 220mg  tablets) for 2 weeks. Icing 15 minutes at a time as needed. Ok to take the tramadol as needed if you still have some of this. Get the [facility] coverage as we discussed. Call me when this goes through. Follow up with me in 1 month.

## 2015-12-07 NOTE — Progress Notes (Signed)
PCP: Tarri FullerESCAJEDA, RICHARD, MD  Subjective:   HPI: Patient is a 50 y.o. male here for bilateral ankle pain.  10/19: Patient reports he's had about 1 month of bilateral ankle swelling and pain. Pain is sharp, 10/10 level. Taking tylenol and wearing inserts. Was given decadron and toradol injections, norco. No skin changes, fever, numbness. Has history of gout. Was given antibiotics as well which he took (keflex).  10/28: Patient reports he has improved since last visit. Tolerating the prednisone dose pack. Pain level is 7/10, still sharp but less. Swelling has improved. Ankles feel best they have in 2 months. Finished keflex. No skin changes, numbness.  Past Medical History:  Diagnosis Date  . Gout   . Knee effusion, left     Current Outpatient Prescriptions on File Prior to Visit  Medication Sig Dispense Refill  . allopurinol (ZYLOPRIM) 100 MG tablet Take 1 tablet (100 mg total) by mouth daily. 30 tablet 0  . amLODipine (NORVASC) 10 MG tablet Take 1 tablet (10 mg total) by mouth daily. 30 tablet 0  . cephALEXin (KEFLEX) 500 MG capsule Take 1 capsule (500 mg total) by mouth 4 (four) times daily. 20 capsule 0  . diphenhydrAMINE (BENADRYL) 25 MG tablet Take 2 tablets (50 mg total) by mouth every 8 (eight) hours as needed for itching. 21 tablet 0  . Diphenhydramine-Acetaminophen (TYLENOL COLD RELIEF PO) Take 5 mLs by mouth every 4 (four) hours as needed. For cold    . hydrochlorothiazide (HYDRODIURIL) 25 MG tablet Take 1 tablet (25 mg total) by mouth daily. 30 tablet 0  . indomethacin (INDOCIN) 25 MG capsule Take 25 mg by mouth 2 (two) times daily with a meal.    . predniSONE (STERAPRED UNI-PAK 21 TAB) 10 MG (21) TBPK tablet Take 6 tabs by mouth daily  for 2 days, then 5 tabs for 2 days, then 4 tabs for 2 days, then 3 tabs for 2 days, 2 tabs for 2 days, then 1 tab by mouth daily for 2 days 42 tablet 0  . traMADol (ULTRAM) 50 MG tablet Take 1 tablet (50 mg total) by mouth every 6  (six) hours as needed. 40 tablet 0   No current facility-administered medications on file prior to visit.     Past Surgical History:  Procedure Laterality Date  . ANTERIOR CRUCIATE LIGAMENT REPAIR      No Known Allergies  Social History   Social History  . Marital status: Married    Spouse name: N/A  . Number of children: N/A  . Years of education: N/A   Occupational History  . Not on file.   Social History Main Topics  . Smoking status: Never Smoker  . Smokeless tobacco: Never Used  . Alcohol use 1.8 oz/week    3 Cans of beer per week     Comment: daily  . Drug use: No  . Sexual activity: Yes    Birth control/ protection: Condom   Other Topics Concern  . Not on file   Social History Narrative  . No narrative on file    No family history on file.  BP (!) 154/89   Pulse 98   Ht 5\' 9"  (1.753 m)   Wt 195 lb (88.5 kg)   BMI 28.80 kg/m   Review of Systems: See HPI above.    Objective:  Physical Exam:  Gen: NAD, comfortable in exam room  Bilateral ankles: Minimal swelling - much improved.  No bruising, other deformity. Mild limitation all directions Minimal  TTP throughout anterior ankle Negative ant drawer and talar tilt.   Negative syndesmotic compression. Thompsons test negative. NV intact distally.    Assessment & Plan:  1. Bilateral ankle pain - 2/2 acute gout flare.  Clinically improved with prednisone.  Switch to alee when finished.  Icing as needed.  Tramadol as needed.  Call us when [facility] coverage goes through.  Will consider checking uric acid in 1 month and starting allopurinol.  F/u 1 month.

## 2015-12-07 NOTE — Assessment & Plan Note (Signed)
2/2 acute gout flare.  Clinically improved with prednisone.  Switch to alee when finished.  Icing as needed.  Tramadol as needed.  Call us when [facility] coverage goes through.  Will consider checking uric acid in 1 month and starting allopurinol.  F/u 1 month.

## 2016-02-26 ENCOUNTER — Encounter (HOSPITAL_BASED_OUTPATIENT_CLINIC_OR_DEPARTMENT_OTHER): Payer: Self-pay | Admitting: Emergency Medicine

## 2016-02-26 ENCOUNTER — Emergency Department (HOSPITAL_BASED_OUTPATIENT_CLINIC_OR_DEPARTMENT_OTHER)
Admission: EM | Admit: 2016-02-26 | Discharge: 2016-02-26 | Disposition: A | Discharge status: Home / Self Care | Payer: Self-pay | Attending: Emergency Medicine | Admitting: Emergency Medicine

## 2016-02-26 DIAGNOSIS — M10042 Idiopathic gout, left hand: Secondary | ICD-10-CM | POA: Insufficient documentation

## 2016-02-26 MED ORDER — INDOMETHACIN 25 MG PO CAPS: 25.0000 mg | ORAL_CAPSULE | Freq: Three times a day (TID) | ORAL | 0 refills | Status: DC | PRN

## 2016-02-26 MED ORDER — OXYCODONE-ACETAMINOPHEN 5-325 MG PO TABS
1.0000 | ORAL_TABLET | Freq: Once | ORAL | Status: AC
  Administered 2016-02-26: 1 via ORAL
  Filled 2016-02-26: qty 1

## 2016-02-26 MED ORDER — INDOMETHACIN 25 MG PO CAPS
25.0000 mg | ORAL_CAPSULE | Freq: Once | ORAL | Status: AC
  Administered 2016-02-26: 25 mg via ORAL
  Filled 2016-02-26: qty 1

## 2016-02-26 MED ORDER — TRAMADOL HCL 50 MG PO TABS: 50.0000 mg | ORAL_TABLET | Freq: Four times a day (QID) | ORAL | 0 refills | Status: DC | PRN

## 2016-02-26 MED ORDER — PREDNISONE 10 MG PO TABS: 20.0000 mg | ORAL_TABLET | Freq: Two times a day (BID) | ORAL | 0 refills | Status: DC

## 2016-02-26 NOTE — ED Triage Notes (Signed)
L hand pain and swelling since Wednesday. Hx of gout, is out of medications.

## 2016-02-26 NOTE — ED Provider Notes (Signed)
[facility] Provider Note   CSN: [id number] Arrival date & time: 02/26/16  1708  By signing my name below, I, Doreatha Martin, attest that this documentation has been prepared under the direction and in the presence of  [facility] M. Damian Leavell, NP. Electronically Signed: Doreatha Martin, ED Scribe. 02/26/16. 5:50 PM.    History   Chief Complaint Chief Complaint  Patient presents with  . Hand Pain    HPI Lawrence [name] is a 51 y.o. male with h/o gout who presents to the Emergency Department complaining of moderate, gradually worsening left hand pain and swelling that began 3 days ago. Pt states his pain is worsened with touch and movement. No alleviating factors noted. Pt states his pain is similar to prior gout flare up. No known recent bites or injuries. He states he has previously taken allopurinol and indomethacin for his gout with adequate relief, but has no PCP so he has not received a refill recently. He denies fever, additional symptoms or complaints.    The history is provided by the patient. No language interpreter was used.  Hand Pain  This is a recurrent problem. The current episode started more than 2 days ago. The problem occurs constantly. The problem has been gradually worsening. Pertinent negatives include no abdominal pain. The symptoms are aggravated by bending. Nothing relieves the symptoms. He has tried nothing for the symptoms. The treatment provided no relief.    Past Medical History:  Diagnosis Date  . Gout   . Knee effusion, left     Patient Active Problem List   Diagnosis Date Noted  . Bilateral ankle pain 11/29/2015  . Effusion of right knee 03/18/2014    Past Surgical History:  Procedure Laterality Date  . ANTERIOR CRUCIATE LIGAMENT REPAIR         Home Medications    Prior to Admission medications   Medication Sig Start Date End Date Taking? Authorizing Provider  allopurinol (ZYLOPRIM) 100 MG tablet Take 1 tablet (100 mg total) by mouth daily.  06/05/13   Kristen N Ward, DO  amLODipine (NORVASC) 10 MG tablet Take 1 tablet (10 mg total) by mouth daily. 03/07/15   Joycie Peek, PA-C  cephALEXin (KEFLEX) 500 MG capsule Take 1 capsule (500 mg total) by mouth 4 (four) times daily. 11/18/15   Danelle Berry, PA-C  diphenhydrAMINE (BENADRYL) 25 MG tablet Take 2 tablets (50 mg total) by mouth every 8 (eight) hours as needed for itching. 07/25/15   Linwood Dibbles, MD  Diphenhydramine-Acetaminophen (TYLENOL COLD RELIEF PO) Take 5 mLs by mouth every 4 (four) hours as needed. For cold    Historical Provider, MD  hydrochlorothiazide (HYDRODIURIL) 25 MG tablet Take 1 tablet (25 mg total) by mouth daily. 03/07/15   Joycie Peek, PA-C  indomethacin (INDOCIN) 25 MG capsule Take 1 capsule (25 mg total) by mouth 3 (three) times daily as needed. 02/26/16   Hope Orlene Och, NP  predniSONE (DELTASONE) 10 MG tablet Take 2 tablets (20 mg total) by mouth 2 (two) times daily with a meal. 02/26/16   Hope Orlene Och, NP  traMADol (ULTRAM) 50 MG tablet Take 1 tablet (50 mg total) by mouth every 6 (six) hours as needed. 02/26/16   Hope Orlene Och, NP    Family History No family history on file.  Social History Social History  Substance Use Topics  . Smoking status: Never Smoker  . Smokeless tobacco: Never Used  . Alcohol use 1.8 oz/week    3 Cans of beer per  week     Comment: daily     Allergies   Patient has no known allergies.   Review of Systems Review of Systems  Constitutional: Negative for fever.  Gastrointestinal: Negative for abdominal pain.  Musculoskeletal: Positive for arthralgias and joint swelling.       Left hand pain  Skin: Negative for wound.     Physical Exam Updated Vital Signs BP 141/85 (BP Location: Right Arm)   Pulse 104   Temp 98 F (36.7 C) (Oral)   Resp 16   Ht 5\' 9"  (1.753 m)   Wt 90.7 kg   SpO2 98%   BMI 29.53 kg/m   Physical Exam  Constitutional: He appears well-developed and well-nourished.  HENT:  Head: Normocephalic.    Eyes: Conjunctivae are normal.  Cardiovascular: Normal rate.   Radial pulses 2+. Adequate circulation.   Pulmonary/Chest: Effort normal. No respiratory distress.  Abdominal: He exhibits no distension.  Musculoskeletal: Normal range of motion. He exhibits edema and tenderness.  There is swelling to the dorsum of the left hand including the fingers. Slightly increased warmth. Tenderness with palpation.    Neurological: He is alert.  Skin: Skin is warm and dry.  Psychiatric: He has a normal mood and affect. His behavior is normal.  Nursing note and vitals reviewed.    ED Treatments / Results   DIAGNOSTIC STUDIES: Oxygen Saturation is 98% on RA, normal by my interpretation.    COORDINATION OF CARE: 5:45 PM Discussed treatment plan with pt at bedside which includes symptomatic therapy and pt agreed to plan.    Labs (all labs ordered are listed, but only abnormal results are displayed) Labs Reviewed - No data to display  Radiology No results found.  Procedures Procedures (including critical care time)  Medications Ordered in ED Medications  indomethacin (INDOCIN) capsule 25 mg (25 mg Oral Given 02/26/16 1804)  oxyCODONE-acetaminophen (PERCOCET/ROXICET) 5-325 MG per tablet 1 tablet (1 tablet Oral Given 02/26/16 1804)     Initial Impression / Assessment and Plan / ED Course  I have reviewed the triage vital signs and the nursing notes.    Pt presents with monoarticular pain, swelling and erythema.  Pt is afebrile and stable. Pt without known peptic ulcer disease and not receiving concurrent treatment on warfarin. Pt dc with indomethacin (25 mg PO TID and prednisone. Patient to f/u with PCP.  Final Clinical Impressions(s) / ED Diagnoses   Final diagnoses:  Acute idiopathic gout of left hand    New Prescriptions Discharge Medication List as of 02/26/2016  5:57 PM    START taking these medications   Details  predniSONE (DELTASONE) 10 MG tablet Take 2 tablets (20 mg total)  by mouth 2 (two) times daily with a meal., Starting Sat 02/26/2016, Print       I personally performed the services described in this documentation, which was scribed in my presence. The recorded information has been reviewed and is accurate.    [address]Hope [place]M Ne[place], [place]NP 02/27/16 [id number]    Rolan BuccoMelanie Belfi, MD 02/27/16 [phone]

## 2016-02-26 NOTE — Discharge Instructions (Signed)
Follow up with your doctor, return here as needed.  °

## 2016-03-20 ENCOUNTER — Encounter: Payer: Self-pay | Admitting: Family Medicine

## 2016-03-20 ENCOUNTER — Ambulatory Visit (INDEPENDENT_AMBULATORY_CARE_PROVIDER_SITE_OTHER): Payer: Self-pay | Admitting: Family Medicine

## 2016-03-20 VITALS — BP 142/86 | HR 121 | Temp 98.4°F | Ht 69.0 in | Wt 200.0 lb

## 2016-03-20 DIAGNOSIS — M25461 Effusion, right knee: Secondary | ICD-10-CM

## 2016-03-20 MED ORDER — METHYLPREDNISOLONE ACETATE 40 MG/ML IJ SUSP
40.0000 mg | Freq: Once | INTRAMUSCULAR | Status: AC
  Administered 2016-03-20: 40 mg via INTRA_ARTICULAR

## 2016-03-20 MED ORDER — HYDROCODONE-ACETAMINOPHEN 5-325 MG PO TABS: 1.0000 | ORAL_TABLET | Freq: Four times a day (QID) | ORAL | 0 refills | Status: DC | PRN

## 2016-03-20 MED FILL — HYDROCODON-APAP 5-325: 5-325 | 3 days supply | Qty: 10 | Fill #0

## 2016-03-20 NOTE — Patient Instructions (Signed)
When you get your insurance card drop this off so we can refer you to a rheumatologist. We aspirated and injected your knee today.

## 2016-03-21 NOTE — Assessment & Plan Note (Signed)
Consistent with inflammatory arthropathy.  Fluid will be sent for analysis, very cloudy.  No recent illness fever, suggestion of infectious etiology.  Short course of hydrocodone prescribed.  Compression, icing, elevation.  Will call him with results.  After informed written consent patient was lying supine on exam table.  Right knee was prepped with alcohol swab.  Utilizing superolateral approach under ultrasound guidance, 44mL of cloudy yellow fluid was aspirated from right knee then right knee was injected with 3:1 bupivicaine:depomedrol.  Patient tolerated procedure well without immediate complications

## 2016-03-21 NOTE — Progress Notes (Addendum)
PCP: Tarri FullerESCAJEDA, RICHARD, MD  Subjective:   HPI: Patient is a 51 y.o. male here for right knee effusion.  2/8: Patient has known history of gout. States over past week has had worsening pain, swelling in right knee. He had gout in his foot prior to this - took indomethacin and prednisone. Feels like a gout flare. No catching, locking, giving out. Remotely tore this ACL.  2/16: Patient reports he has improved since last week but pain still 6/10 and difficulty bearing weight. Swelling has improved but some still present.  4/18: Patient reports his right knee started to become sore again on Friday morning. Increased swelling as well - very severe by Saturday and came to ED. Had 35mL aspirated from his knee - bloody appearance to this. Denies known injury. Taking norco as needed.  8/23: Patient reports he came to ED recently for worsening pain in right knee and ankle. Pain level 10/10 with throbbing. Had 20mL aspirated from knee - surprisingly no crystals seen in ED fluid though 4649 WBCs. Consideration was given to rheumatology referral.  9/23: Patient returns with continued soreness in his right knee, 8/10 level Warmth. Difficulty bearing weight. Transient improvement after last visit.  12/29/14: Patient reports he slipped on steps Saturday and knee buckled, swelling returned. Pain level 10/10, sharp anterior knee. Difficulty bearing weight.  Has not put in to get [facility] coverage to date. Using crutches. No skin changes, fever, other complaints.  03/20/16: Patient returns with swelling, pain in right knee. Started about a week ago without any injury. Pain level 10/10, sharp anterior knee. Difficulty getting comfortable, walking. No fever, chills, redness, recent illness. He will get insurance with his employer over the next week. No skin changes.  Past Medical History:  Diagnosis Date  . Gout   . Knee effusion, left     Current Outpatient Prescriptions on File  Prior to Visit  Medication Sig Dispense Refill  . allopurinol (ZYLOPRIM) 100 MG tablet Take 1 tablet (100 mg total) by mouth daily. 30 tablet 0  . amLODipine (NORVASC) 10 MG tablet Take 1 tablet (10 mg total) by mouth daily. 30 tablet 0  . cephALEXin (KEFLEX) 500 MG capsule Take 1 capsule (500 mg total) by mouth 4 (four) times daily. 20 capsule 0  . diphenhydrAMINE (BENADRYL) 25 MG tablet Take 2 tablets (50 mg total) by mouth every 8 (eight) hours as needed for itching. 21 tablet 0  . Diphenhydramine-Acetaminophen (TYLENOL COLD RELIEF PO) Take 5 mLs by mouth every 4 (four) hours as needed. For cold    . hydrochlorothiazide (HYDRODIURIL) 25 MG tablet Take 1 tablet (25 mg total) by mouth daily. 30 tablet 0  . indomethacin (INDOCIN) 25 MG capsule Take 1 capsule (25 mg total) by mouth 3 (three) times daily as needed. 30 capsule 0  . predniSONE (DELTASONE) 10 MG tablet Take 2 tablets (20 mg total) by mouth 2 (two) times daily with a meal. 16 tablet 0  . traMADol (ULTRAM) 50 MG tablet Take 1 tablet (50 mg total) by mouth every 6 (six) hours as needed. 15 tablet 0   No current facility-administered medications on file prior to visit.     Past Surgical History:  Procedure Laterality Date  . ANTERIOR CRUCIATE LIGAMENT REPAIR      No Known Allergies  Social History   Social History  . Marital status: Married    Spouse name: N/A  . Number of children: N/A  . Years of education: N/A   Occupational History  .  Not on file.   Social History Main Topics  . Smoking status: Never Smoker  . Smokeless tobacco: Never Used  . Alcohol use 1.8 oz/week    3 Cans of beer per week     Comment: daily  . Drug use: No  . Sexual activity: Yes    Birth control/ protection: Condom   Other Topics Concern  . Not on file   Social History Narrative  . No narrative on file    No family history on file.  BP (!) 142/86   Pulse (!) 121   Temp 98.4 F (36.9 C)   Ht 5\' 9"  (1.753 m)   Wt 200 lb (90.7  kg)   BMI 29.53 kg/m   Review of Systems: See HPI above.    Objective:  Physical Exam:  Gen: NAD  Right knee: Large effusion.  Quad atrophy.  No bruising, other deformity.  Warmth but no erythema. Diffuse anterior tenderness. ROM 0 - 90 degrees - pain worse with flexion. Negative ant drawer, post drawer, valgus/varus stress.  Pain with apleys, mcmurrays.  Negative patellar apprehension. NV intact distally.  Left knee: FROM without pain.    Assessment & Plan:  1. Right knee effusion/pain - Consistent with inflammatory arthropathy.  Fluid will be sent for analysis, very cloudy.  No recent illness fever, suggestion of infectious etiology.  Short course of hydrocodone prescribed.  Compression, icing, elevation.  Will call him with results.  After informed written consent patient was lying supine on exam table.  Right knee was prepped with alcohol swab.  Utilizing superolateral approach under ultrasound guidance, 44mL of cloudy yellow fluid was aspirated from right knee then right knee was injected with 3:1 bupivicaine:depomedrol.  Patient tolerated procedure well without immediate complications  Addendum:  Synovial fluid analysis performed - extremely high cell count with PMNs, noted gout crystals as well.  Culture negative.  Suspect combination of acute gout and inflammatory arthropathy given very high count not expected for gout flare.  And in past has had elevated count without gout crystals in his effusion.  Plan to call us when insurance goes through to refer to rheumatology.  In future consider increase in allopurinol but not during acute flare.

## 2016-03-22 LAB — SYNOVIAL FLUID, CELL COUNT
Eos, Fluid: 0 %
LINING CELLS, SYNOVIAL: 0 %
Lymphs, Fluid: 1 %
Macrophages Fld: 8 %
Nuc cell # Fld: 59410 cells/uL — ABNORMAL HIGH (ref 0–200)
Polys, Fluid: 91 %
RBC, Fluid: 20000 /uL

## 2016-03-24 LAB — BODY FLUID CULTURE

## 2016-03-31 ENCOUNTER — Emergency Department (HOSPITAL_BASED_OUTPATIENT_CLINIC_OR_DEPARTMENT_OTHER)
Admission: EM | Admit: 2016-03-31 | Discharge: 2016-03-31 | Disposition: A | Discharge status: Home / Self Care | Payer: Self-pay | Attending: Emergency Medicine | Admitting: Emergency Medicine

## 2016-03-31 ENCOUNTER — Encounter (HOSPITAL_BASED_OUTPATIENT_CLINIC_OR_DEPARTMENT_OTHER): Payer: Self-pay | Admitting: *Deleted

## 2016-03-31 DIAGNOSIS — M10041 Idiopathic gout, right hand: Secondary | ICD-10-CM | POA: Insufficient documentation

## 2016-03-31 DIAGNOSIS — M10061 Idiopathic gout, right knee: Secondary | ICD-10-CM | POA: Insufficient documentation

## 2016-03-31 DIAGNOSIS — M10042 Idiopathic gout, left hand: Secondary | ICD-10-CM | POA: Insufficient documentation

## 2016-03-31 DIAGNOSIS — M109 Gout, unspecified: Secondary | ICD-10-CM

## 2016-03-31 MED ORDER — INDOMETHACIN 25 MG PO CAPS: 25.0000 mg | ORAL_CAPSULE | Freq: Once | ORAL | Status: DC

## 2016-03-31 MED ORDER — INDOMETHACIN 25 MG PO CAPS: 25.0000 mg | ORAL_CAPSULE | Freq: Three times a day (TID) | ORAL | 0 refills | Status: DC | PRN

## 2016-03-31 MED ORDER — PREDNISONE 10 MG (21) PO TBPK: ORAL_TABLET | Freq: Every day | ORAL | 0 refills | Status: DC

## 2016-03-31 MED ORDER — KETOROLAC TROMETHAMINE 30 MG/ML IJ SOLN
30.0000 mg | Freq: Once | INTRAMUSCULAR | Status: AC
  Administered 2016-03-31: 30 mg via INTRAMUSCULAR
  Filled 2016-03-31: qty 1

## 2016-03-31 MED ORDER — OXYCODONE-ACETAMINOPHEN 5-325 MG PO TABS
1.0000 | ORAL_TABLET | Freq: Once | ORAL | Status: AC
Start: 2016-03-31 — End: 2016-03-31
  Administered 2016-03-31: 1 via ORAL
  Filled 2016-03-31: qty 1

## 2016-03-31 MED FILL — INDOMETHACIN 25 MG CAPSULE: 25 | 8 days supply | Qty: 25 | Fill #0

## 2016-03-31 MED FILL — predniSONE 10 MG TABS: 10 | 10 days supply | Qty: 30 | Fill #0

## 2016-03-31 NOTE — ED Triage Notes (Signed)
Pt reports right knee, left and right hand swelling and pain x 1 month, his usual gout presentation in all areas, states he has not been prescribed any medications, states he had his right knee drained by dr. Pearletha Forgehudnall last Monday, but cont with pain and swelling.

## 2016-03-31 NOTE — Discharge Instructions (Signed)
Please follow-up with Dr. Pearletha ForgeHudnall.  Return for worsening symptoms, including fever, escalating pain, inability to walk or any other symptoms concerning to you.

## 2016-03-31 NOTE — ED Provider Notes (Signed)
[facility] Provider Note   CSN: [id number] Arrival date & time: 03/31/16  0757     History   Chief Complaint Chief Complaint  Patient presents with  . Gout    HPI Lawrence [name] is a 51 y.o. male.  HPI 51 year old male who presents with right knee pain and bilateral hand pain. He has a history of gout. States that he follows with Dr. Pearletha ForgeHudnall has gout. Has been having left third and fourth digit knuckle pain over the past 2 weeks, slowly resolving. Over the past week, developed pain over the MCP knuckles of the right hand and has had right knee pain consistent with gout. Has not had fevers, chills, chest pain or difficulty breathing, nausea or vomiting. Was seen by Dr. Pearletha ForgeHudnall over a week ago with arthrocentesis of the right knee that was consistent with gout. He was not started on any medications. No numbness or weakness. Past Medical History:  Diagnosis Date  . Gout   . Knee effusion, left     Patient Active Problem List   Diagnosis Date Noted  . Bilateral ankle pain 11/29/2015  . Effusion of right knee 03/18/2014    Past Surgical History:  Procedure Laterality Date  . ANTERIOR CRUCIATE LIGAMENT REPAIR         Home Medications    Prior to Admission medications   Medication Sig Start Date End Date Taking? Authorizing Provider  allopurinol (ZYLOPRIM) 100 MG tablet Take 1 tablet (100 mg total) by mouth daily. 06/05/13   Kristen N Ward, DO  amLODipine (NORVASC) 10 MG tablet Take 1 tablet (10 mg total) by mouth daily. 03/07/15   Joycie PeekBenjamin Cartner, PA-C  cephALEXin (KEFLEX) 500 MG capsule Take 1 capsule (500 mg total) by mouth 4 (four) times daily. 11/18/15   Danelle BerryLeisa Tapia, PA-C  diphenhydrAMINE (BENADRYL) 25 MG tablet Take 2 tablets (50 mg total) by mouth every 8 (eight) hours as needed for itching. 07/25/15   Linwood DibblesJon Knapp, MD  Diphenhydramine-Acetaminophen (TYLENOL COLD RELIEF PO) Take 5 mLs by mouth every 4 (four) hours as needed. For cold    Historical Provider,  MD  hydrochlorothiazide (HYDRODIURIL) 25 MG tablet Take 1 tablet (25 mg total) by mouth daily. 03/07/15   Joycie PeekBenjamin Cartner, PA-C  HYDROcodone-acetaminophen (NORCO) 5-325 MG tablet Take 1 tablet by mouth every 6 (six) hours as needed for moderate pain. 03/20/16   Lenda KelpShane R Hudnall, MD  indomethacin (INDOCIN) 25 MG capsule Take 1 capsule (25 mg total) by mouth 3 (three) times daily as needed. 02/26/16   Hope Orlene OchM Neese, NP  predniSONE (DELTASONE) 10 MG tablet Take 2 tablets (20 mg total) by mouth 2 (two) times daily with a meal. 02/26/16   Hope Orlene OchM Neese, NP  traMADol (ULTRAM) 50 MG tablet Take 1 tablet (50 mg total) by mouth every 6 (six) hours as needed. 02/26/16   Hope Orlene OchM Neese, NP    Family History History reviewed. No pertinent family history.  Social History Social History  Substance Use Topics  . Smoking status: Never Smoker  . Smokeless tobacco: Never Used  . Alcohol use 1.8 oz/week    3 Cans of beer per week     Comment: daily     Allergies   Patient has no known allergies.   Review of Systems Review of Systems 10/14 systems reviewed and are negative other than those stated in the HPI   Physical Exam Updated Vital Signs BP (!) 190/112 (BP Location: Left Arm)   Pulse 88  Temp 98.2 F (36.8 C) (Oral)   Resp 18   Ht 5\' 9"  (1.753 m)   Wt 200 lb (90.7 kg)   SpO2 100%   BMI 29.53 kg/m   Physical Exam Physical Exam  Nursing note and vitals reviewed. Constitutional: Well developed, well nourished, non-toxic, and in no acute distress Head: Normocephalic and atraumatic.  Mouth/Throat: Oropharynx is clear and moist.  Neck: Normal range of motion. Neck supple.  Cardiovascular: Normal rate and regular rhythm.   Pulmonary/Chest: Effort normal and breath sounds normal.  Abdominal: Soft. There is no tenderness. There is no rebound and no guarding.  Musculoskeletal: There is mild swelling, warmth overlying the right knee with limited range of motion due to pain but able to ambulate  and do some range of motion. There is also swelling of the MCP joints of the right hand with slight warmth and limited range of motion due to pain. PIP joint swelling of the left second and third digits with some limited range of motion due to pain.  Neurological: Alert, no facial droop, fluent speech, moves all extremities symmetrically, sensation to light touch intact throughout Skin: Skin is warm and dry.  Psychiatric: Cooperative   ED Treatments / Results  Labs (all labs ordered are listed, but only abnormal results are displayed) Labs Reviewed - No data to display  EKG  EKG Interpretation None       Radiology No results found.  Procedures Procedures (including critical care time)  Medications Ordered in ED Medications  oxyCODONE-acetaminophen (PERCOCET/ROXICET) 5-325 MG per tablet 1 tablet (not administered)  ketorolac (TORADOL) 30 MG/ML injection 30 mg (not administered)     Initial Impression / Assessment and Plan / ED Course  I have reviewed the triage vital signs and the nursing notes.  Pertinent labs & imaging results that were available during my care of the patient were reviewed by me and considered in my medical decision making (see chart for details).     Presenting with joint pain of the right knee and bilateral hand that is consistent with his prior history of gout. Records are reviewed and he had arthrocentesis of the right knee consistent with gout on February 12. No fever or concerns for septic arthritis at this time. We'll initiate anti-inflammatory medications as well as steroids for gout treatment. No prior renal disease history. Discussed follow-up with Dr. Pearletha Forge. Strict return and follow-up instructions reviewed. He expressed understanding of all discharge instructions and felt comfortable with the plan of care.   Final Clinical Impressions(s) / ED Diagnoses   Final diagnoses:  Acute gout of right knee, unspecified cause  Acute gout of right  hand, unspecified cause  Acute gout of left hand, unspecified cause    New Prescriptions New Prescriptions   No medications on file     Lavera Guise, MD 03/31/16 [phone]

## 2016-04-28 ENCOUNTER — Emergency Department (HOSPITAL_BASED_OUTPATIENT_CLINIC_OR_DEPARTMENT_OTHER)
Admission: EM | Admit: 2016-04-28 | Discharge: 2016-04-28 | Disposition: A | Discharge status: Home / Self Care | Payer: Self-pay | Attending: Emergency Medicine | Admitting: Emergency Medicine

## 2016-04-28 ENCOUNTER — Encounter (HOSPITAL_BASED_OUTPATIENT_CLINIC_OR_DEPARTMENT_OTHER): Payer: Self-pay | Admitting: *Deleted

## 2016-04-28 DIAGNOSIS — M10041 Idiopathic gout, right hand: Secondary | ICD-10-CM | POA: Insufficient documentation

## 2016-04-28 DIAGNOSIS — Z79899 Other long term (current) drug therapy: Secondary | ICD-10-CM | POA: Insufficient documentation

## 2016-04-28 DIAGNOSIS — M109 Gout, unspecified: Secondary | ICD-10-CM

## 2016-04-28 MED ORDER — PREDNISONE 20 MG PO TABS: 40.0000 mg | ORAL_TABLET | Freq: Every day | ORAL | 0 refills | Status: AC

## 2016-04-28 MED ORDER — HYDROCODONE-ACETAMINOPHEN 5-325 MG PO TABS: 1.0000 | ORAL_TABLET | Freq: Four times a day (QID) | ORAL | 0 refills | Status: DC | PRN

## 2016-04-28 MED ORDER — INDOMETHACIN 25 MG PO CAPS: 25.0000 mg | ORAL_CAPSULE | Freq: Three times a day (TID) | ORAL | 0 refills | Status: DC | PRN

## 2016-04-28 MED ORDER — ALLOPURINOL 100 MG PO TABS: 100.0000 mg | ORAL_TABLET | Freq: Every day | ORAL | 0 refills | Status: DC

## 2016-04-28 MED FILL — predniSONE 20 MG TABS: 20 | 5 days supply | Qty: 10 | Fill #0

## 2016-04-28 MED FILL — HYDROCODON-APAP 5-325: 5-325 | 3 days supply | Qty: 10 | Fill #0

## 2016-04-28 MED FILL — ALLOPURINOL 100 MG TABLET: 100 | 30 days supply | Qty: 30 | Fill #0

## 2016-04-28 MED FILL — INDOMETHACIN 25 MG CAPSULE: 25 | 10 days supply | Qty: 30 | Fill #0

## 2016-04-28 NOTE — Discharge Instructions (Signed)

## 2016-04-28 NOTE — ED Triage Notes (Signed)
Right hand pain. States he needs medication for gout.

## 2016-04-28 NOTE — ED Provider Notes (Signed)
Emergency Department Provider Note   I have reviewed the triage vital signs and the nursing notes.   HISTORY  Chief Complaint Hand Pain   HPI Lawrence [name] is a 51 y.o. male with PMH of gout presents to the emergency department for evaluation of right index finger pain consistent with prior episodes of gout. Patient states that his pain has been present for the past week. He works with his hands frequently and has noticed worsening pain and swelling at the base of the first finger. He has noticed some redness. Denies any fevers or chills. He still able to work and move his finger but it is painful. 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   Past Medical History:  Diagnosis Date  . Gout   . Knee effusion, left     Patient Active Problem List   Diagnosis Date Noted  . Bilateral ankle pain 11/29/2015  . Effusion of right knee 03/18/2014    Past Surgical History:  Procedure Laterality Date  . ANTERIOR CRUCIATE LIGAMENT REPAIR      Current Outpatient Rx  . Order #: [id number] Class: Print  . Order #: [id number] Class: Print  . Order #: [id number] Class: Print  . Order #: [id number] Class: Print  . Order #: [id number] Class: Historical Med  . Order #: [id number] Class: Print  . Order #: [id number] Class: Print  . Order #: [id number] Class: Print  . Order #: [id number] Class: Print  . Order #: [id number] Class: Print    Allergies Patient has no known allergies.  No family history on file.  Social History Social History  Substance Use Topics  . Smoking status: Never Smoker  . Smokeless tobacco: Never Used  . Alcohol use 1.8 oz/week    3 Cans of beer per week     Comment: daily    Review of Systems  10-point ROS otherwise negative.  ____________________________________________   PHYSICAL EXAM:  VITAL SIGNS: ED Triage Vitals  Enc Vitals Group     BP 04/28/16 1151 (!) 132/94     Pulse Rate  04/28/16 1151 95     Resp 04/28/16 1151 20     Temp 04/28/16 1151 98.3 F (36.8 C)     Temp Source 04/28/16 1151 Oral     SpO2 04/28/16 1151 98 %     Weight 04/28/16 1150 200 lb (90.7 kg)     Height 04/28/16 1150 5\' 9"  (1.753 m)     Pain Score 04/28/16 1150 10   Constitutional: Alert and oriented. Well appearing and in no acute distress. Eyes: Conjunctivae are normal.  Head: Atraumatic. Nose: No congestion/rhinnorhea. Mouth/Throat: Mucous membranes are moist.  Oropharynx non-erythematous. Neck: No stridor.   Cardiovascular: Normal rate, regular rhythm. Good peripheral circulation. Grossly normal heart sounds.   Respiratory: Normal respiratory effort.  No retractions. Lungs CTAB. Gastrointestinal: Soft and nontender. No distention.  Musculoskeletal: No lower extremity tenderness nor edema. No gross deformities of extremities. Positive right 2nd finger swelling focused at the MTP joint. Minimal redness. Normal ROM. No warmth to touch.  Neurologic:  Normal speech and language. No gross focal neurologic deficits are appreciated.  Skin:  Skin is warm, dry and intact. No rash noted.  ____________________________________________   PROCEDURES  Procedure(s) performed:   Procedures  None ____________________________________________   INITIAL IMPRESSION / ASSESSMENT AND PLAN / ED COURSE  Pertinent labs & imaging results that were available during my care of the patient were reviewed by me and  considered in my medical decision making (see chart for details).  Patient presents to the ED for evaluation of pain and swelling in the left hand. Reports that this is typical of his gout flares and he typically responds to indomethacin. Has not taken Allopurinol because of funning out. No PCP currently with lapse in insurance. No evidence of septic joint. Plan for indomethacin and pain medication at home. Will give steroids for 5 day burst. Refilled Allopurinol for preventative meds at home.    At this time, I do not feel there is any life-threatening condition present. I have reviewed and discussed all results (EKG, imaging, lab, urine as appropriate), exam findings with patient. I have reviewed nursing notes and appropriate previous records.  I feel the patient is safe to be discharged home without further emergent workup. Discussed usual and customary return precautions. Patient and family (if present) verbalize understanding and are comfortable with this plan.  Patient will follow-up with their primary care provider. If they do not have a primary care provider, information for follow-up has been provided to them. All questions have been answered.    ____________________________________________  FINAL CLINICAL IMPRESSION(S) / ED DIAGNOSES  Final diagnoses:  Acute gout of right hand, unspecified cause     MEDICATIONS GIVEN DURING THIS VISIT:  None  NEW OUTPATIENT MEDICATIONS STARTED DURING THIS VISIT:  New Prescriptions   HYDROCODONE-ACETAMINOPHEN (NORCO/VICODIN) 5-325 MG TABLET    Take 1 tablet by mouth every 6 (six) hours as needed.   INDOMETHACIN (INDOCIN) 25 MG CAPSULE    Take 1 capsule (25 mg total) by mouth 3 (three) times daily as needed.   PREDNISONE (DELTASONE) 20 MG TABLET    Take 2 tablets (40 mg total) by mouth daily.      Note:  This document was prepared using Dragon voice recognition software and may include unintentional dictation errors.  Alona BeneJoshua Molly Maselli, MD Emergency Medicine   Maia PlanJoshua G Gilmar Bua, MD 04/28/16 [phone]

## 2016-05-30 ENCOUNTER — Encounter (HOSPITAL_BASED_OUTPATIENT_CLINIC_OR_DEPARTMENT_OTHER): Payer: Self-pay | Admitting: *Deleted

## 2016-05-30 ENCOUNTER — Emergency Department (HOSPITAL_BASED_OUTPATIENT_CLINIC_OR_DEPARTMENT_OTHER)
Admission: EM | Admit: 2016-05-30 | Discharge: 2016-05-30 | Disposition: A | Discharge status: Home / Self Care | Payer: Self-pay | Attending: Emergency Medicine | Admitting: Emergency Medicine

## 2016-05-30 DIAGNOSIS — M1A0620 Idiopathic chronic gout, left knee, without tophus (tophi): Secondary | ICD-10-CM | POA: Insufficient documentation

## 2016-05-30 MED ORDER — COLCHICINE 0.6 MG PO TABS
0.6000 mg | ORAL_TABLET | Freq: Once | ORAL | Status: AC
  Administered 2016-05-30: 0.6 mg via ORAL
  Filled 2016-05-30: qty 1

## 2016-05-30 MED ORDER — COLCHICINE 0.6 MG PO TABS
ORAL_TABLET | ORAL | Status: AC
  Filled 2016-05-30: qty 1

## 2016-05-30 MED ORDER — PREDNISONE 50 MG PO TABS
60.0000 mg | ORAL_TABLET | Freq: Once | ORAL | Status: AC
  Administered 2016-05-30: 60 mg via ORAL
  Filled 2016-05-30: qty 1

## 2016-05-30 MED ORDER — PROMETHAZINE HCL 25 MG/ML IJ SOLN: 12.5000 mg | Freq: Once | INTRAMUSCULAR | Status: DC

## 2016-05-30 MED ORDER — TRIAMCINOLONE ACETONIDE 40 MG/ML IJ SUSP
40.0000 mg | Freq: Once | INTRAMUSCULAR | Status: AC
  Administered 2016-05-30: 40 mg via INTRAMUSCULAR
  Filled 2016-05-30: qty 5

## 2016-05-30 MED ORDER — OXYCODONE-ACETAMINOPHEN 5-325 MG PO TABS: 2.0000 | ORAL_TABLET | ORAL | 0 refills | Status: DC | PRN

## 2016-05-30 MED ORDER — COLCHICINE 0.6 MG PO TABS: 0.6000 mg | ORAL_TABLET | Freq: Every day | ORAL | 1 refills | Status: DC

## 2016-05-30 MED ORDER — OXYCODONE-ACETAMINOPHEN 5-325 MG PO TABS
1.0000 | ORAL_TABLET | Freq: Once | ORAL | Status: AC
  Administered 2016-05-30: 1 via ORAL
  Filled 2016-05-30: qty 1

## 2016-05-30 MED ORDER — PREDNISONE 20 MG PO TABS: 20.0000 mg | ORAL_TABLET | Freq: Two times a day (BID) | ORAL | 0 refills | Status: DC

## 2016-05-30 MED ORDER — BUPIVACAINE HCL 0.5 % IJ SOLN
30.0000 mL | Freq: Once | INTRAMUSCULAR | Status: AC
  Administered 2016-05-30: 30 mL
  Filled 2016-05-30: qty 1

## 2016-05-30 NOTE — ED Triage Notes (Signed)
Swelling and pain left knee x 4 days. No known injury.

## 2016-05-30 NOTE — ED Notes (Signed)
ED Provider at bedside. 

## 2016-05-30 NOTE — ED Notes (Signed)
ED Provider at bedside to inject knee

## 2016-05-30 NOTE — Discharge Instructions (Signed)
Continue medications. No driving while taking Percocet. Follow-up with your primary care physician.

## 2016-06-03 NOTE — ED Provider Notes (Signed)
[facility]PT Provider Note   CSN: [id number] Arrival date & time: 05/30/16  1209     History   Chief Complaint Chief Complaint  Patient presents with  . Knee Pain    HPI Lawrence [name] is a 51 y.o. male.Chief complaint is left knee pain and swelling.  HPI 51 year old male with history of gout. He has had flare of his left knee with pain and swelling over the last several days. Has had good results with arthrocentesis as well as oral medications in the past. No fever. No fall or injury.  Past Medical History:  Diagnosis Date  . Gout   . Knee effusion, left     Patient Active Problem List   Diagnosis Date Noted  . Bilateral ankle pain 11/29/2015  . Effusion of right knee 03/18/2014    Past Surgical History:  Procedure Laterality Date  . ANTERIOR CRUCIATE LIGAMENT REPAIR         Home Medications    Prior to Admission medications   Medication Sig Start Date End Date Taking? Authorizing Provider  allopurinol (ZYLOPRIM) 100 MG tablet Take 1 tablet (100 mg total) by mouth daily. 04/28/16   Maia Plan, MD  amLODipine (NORVASC) 10 MG tablet Take 1 tablet (10 mg total) by mouth daily. 03/07/15   Joycie Peek, PA-C  cephALEXin (KEFLEX) 500 MG capsule Take 1 capsule (500 mg total) by mouth 4 (four) times daily. 11/18/15   Danelle Berry, PA-C  colchicine 0.6 MG tablet Take 1 tablet (0.6 mg total) by mouth daily. 05/30/16   Rolland Porter, MD  diphenhydrAMINE (BENADRYL) 25 MG tablet Take 2 tablets (50 mg total) by mouth every 8 (eight) hours as needed for itching. 07/25/15   Linwood Dibbles, MD  Diphenhydramine-Acetaminophen (TYLENOL COLD RELIEF PO) Take 5 mLs by mouth every 4 (four) hours as needed. For cold    Historical Provider, MD  hydrochlorothiazide (HYDRODIURIL) 25 MG tablet Take 1 tablet (25 mg total) by mouth daily. 03/07/15   Joycie Peek, PA-C  HYDROcodone-acetaminophen (NORCO/VICODIN) 5-325 MG tablet Take 1 tablet by mouth every 6 (six) hours as needed. 04/28/16    Maia Plan, MD  indomethacin (INDOCIN) 25 MG capsule Take 1 capsule (25 mg total) by mouth 3 (three) times daily as needed. 04/28/16   Maia Plan, MD  oxyCODONE-acetaminophen (PERCOCET/ROXICET) 5-325 MG tablet Take 2 tablets by mouth every 4 (four) hours as needed. 05/30/16   Rolland Porter, MD  predniSONE (DELTASONE) 20 MG tablet Take 1 tablet (20 mg total) by mouth 2 (two) times daily with a meal. 05/30/16   Rolland Porter, MD  traMADol (ULTRAM) 50 MG tablet Take 1 tablet (50 mg total) by mouth every 6 (six) hours as needed. 02/26/16   Hope Orlene Och, NP    Family History No family history on file.  Social History Social History  Substance Use Topics  . Smoking status: Never Smoker  . Smokeless tobacco: Never Used  . Alcohol use 1.8 oz/week    3 Cans of beer per week     Comment: daily     Allergies   Patient has no known allergies.   Review of Systems Review of Systems  Constitutional: Negative for appetite change, chills, diaphoresis, fatigue and fever.  HENT: Negative for mouth sores, sore throat and trouble swallowing.   Eyes: Negative for visual disturbance.  Respiratory: Negative for cough, chest tightness, shortness of breath and wheezing.   Cardiovascular: Negative for chest pain.  Gastrointestinal: Negative for abdominal distention, abdominal  pain, diarrhea, nausea and vomiting.  Endocrine: Negative for polydipsia, polyphagia and polyuria.  Genitourinary: Negative for dysuria, frequency and hematuria.  Musculoskeletal: Positive for arthralgias. Negative for gait problem.       Left knee pain and swelling  Skin: Negative for color change, pallor and rash.  Neurological: Negative for dizziness, syncope, light-headedness and headaches.  Hematological: Does not bruise/bleed easily.  Psychiatric/Behavioral: Negative for behavioral problems and confusion.     Physical Exam Updated Vital Signs BP (!) 151/105 (BP Location: Right Arm)   Pulse (!) 107   Temp 99 F (37.2 C)  (Oral)   Resp 20   Ht  (1.753 m)   Wt 205 lb (93 kg)   SpO2 98%   BMI 30.27 kg/m   Physical Exam  Constitutional: He is oriented to person, place, and time. He appears well-developed and well-nourished. No distress.  HENT:  Head: Normocephalic.  Eyes: Conjunctivae are normal. Pupils are equal, round, and reactive to light. No scleral icterus.  Neck: Normal range of motion. Neck supple. No thyromegaly present.  Cardiovascular: Normal rate and regular rhythm.  Exam reveals no gallop and no friction rub.   No murmur heard. Pulmonary/Chest: Effort normal and breath sounds normal. No respiratory distress. He has no wheezes. He has no rales.  Abdominal: Soft. Bowel sounds are normal. He exhibits no distension. There is no tenderness. There is no rebound.  Musculoskeletal: Normal range of motion.  Painful range of motion of the left knee. Palpable effusion. Palpable distention of the suprapatellar bursa. Erythematous. Not warm to the touch. Patient is afebrile.  Neurological: He is alert and oriented to person, place, and time.  Skin: Skin is warm and dry. No rash noted.  Psychiatric: He has a normal mood and affect. His behavior is normal.     ED Treatments / Results  Labs (all labs ordered are listed, but only abnormal results are displayed) Labs Reviewed - No data to display  EKG  EKG Interpretation None       Radiology No results found.  Procedures Procedures (including critical care time)  Medications Ordered in ED Medications  bupivacaine (MARCAINE) 0.5 % (with pres) injection 30 mL (30 mLs Infiltration Given by Other 05/30/16 1351)  triamcinolone acetonide (KENALOG-40) injection 40 mg (40 mg Intramuscular Given 05/30/16 1351)  predniSONE (DELTASONE) tablet 60 mg (60 mg Oral Given 05/30/16 1346)  colchicine tablet 0.6 mg (0.6 mg Oral Given 05/30/16 1346)  oxyCODONE-acetaminophen (PERCOCET/ROXICET) 5-325 MG per tablet 1 tablet (1 tablet Oral Given 05/30/16 1345)      Initial Impression / Assessment and Plan / ED Course  I have reviewed the triage vital signs and the nursing notes.  Pertinent labs & imaging results that were available during my care of the patient were reviewed by me and considered in my medical decision making (see chart for details).     She has strong desire to have arthrocentesis performed therapeutically  ARTHOCENTESIS Performed by: Claudean Kinds Consent: Verbal consent obtained. Risks and benefits: risks, benefits and alternatives were discussed Consent given by: patient Required items: required blood products, implants, devices, and special equipment available Patient identity confirmed: verbally with patient Time out: Immediately prior to procedure a "time out" was called to verify the correct patient, procedure, equipment, support staff and site/side marked as required. Indications: gout, effusion, pain Joint: Lt knee Local anesthesia used: 0.5%marcaine without epi Preparation: Patient was prepped and draped in the usual sterile fashion. Aspirate appearance: thin yellow Aspirate amount: 80  ml Patient tolerance: Patient tolerated the procedure well with no immediate complications.  point injected with Kenalog 40 mg and Marcaine 0.5% without epinephrine 5 mL. She tolerated well and had excellent relief of symptoms.  Final Clinical Impressions(s) / ED Diagnoses   Final diagnoses:  Idiopathic chronic gout of left knee without tophus    Discharge with colchicine, prednisone, Percocet. Ace wrap applied. Elevate and compress tonight. Primary care follow-up.  New Prescriptions Discharge Medication List as of 05/30/2016  1:41 PM    START taking these medications   Details  colchicine 0.6 MG tablet Take 1 tablet (0.6 mg total) by mouth daily., Starting Tue 05/30/2016, Print    oxyCODONE-acetaminophen (PERCOCET/ROXICET) 5-325 MG tablet Take 2 tablets by mouth every 4 (four) hours as needed., Starting Tue  05/30/2016, Print    predniSONE (DELTASONE) 20 MG tablet Take 1 tablet (20 mg total) by mouth 2 (two) times daily with a meal., Starting Tue 05/30/2016, Print         Rolland Porter, MD 06/03/16 1531

## 2016-06-08 ENCOUNTER — Ambulatory Visit: Payer: Self-pay | Admitting: Family Medicine

## 2016-08-18 IMAGING — CR DG KNEE 1-2V*R*
2 series · 2 of 2 positions shown · non-contrast
Comparison: July 01, 2009.

CLINICAL DATA: Acute right knee pain and swelling without known
injury. Initial encounter.

EXAM:
RIGHT KNEE - 1-2 VIEW

[t knee ap right]
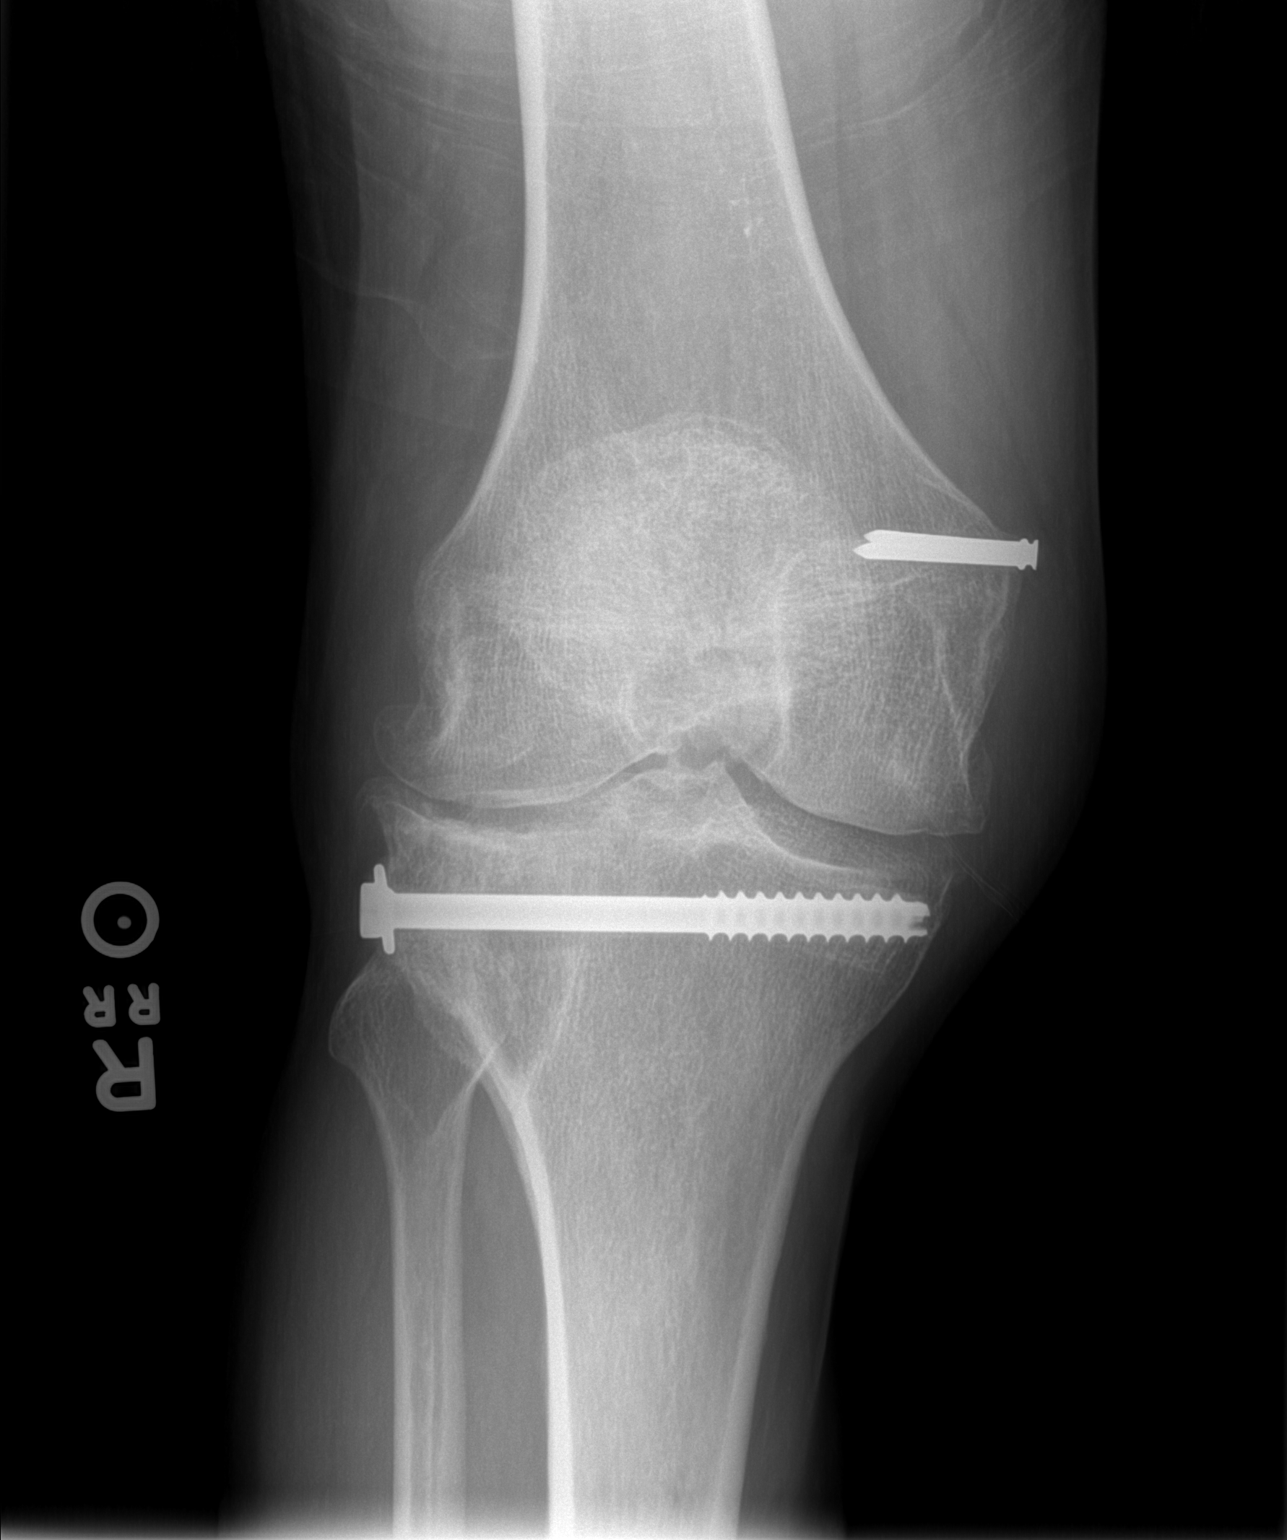

[t knee lat right]
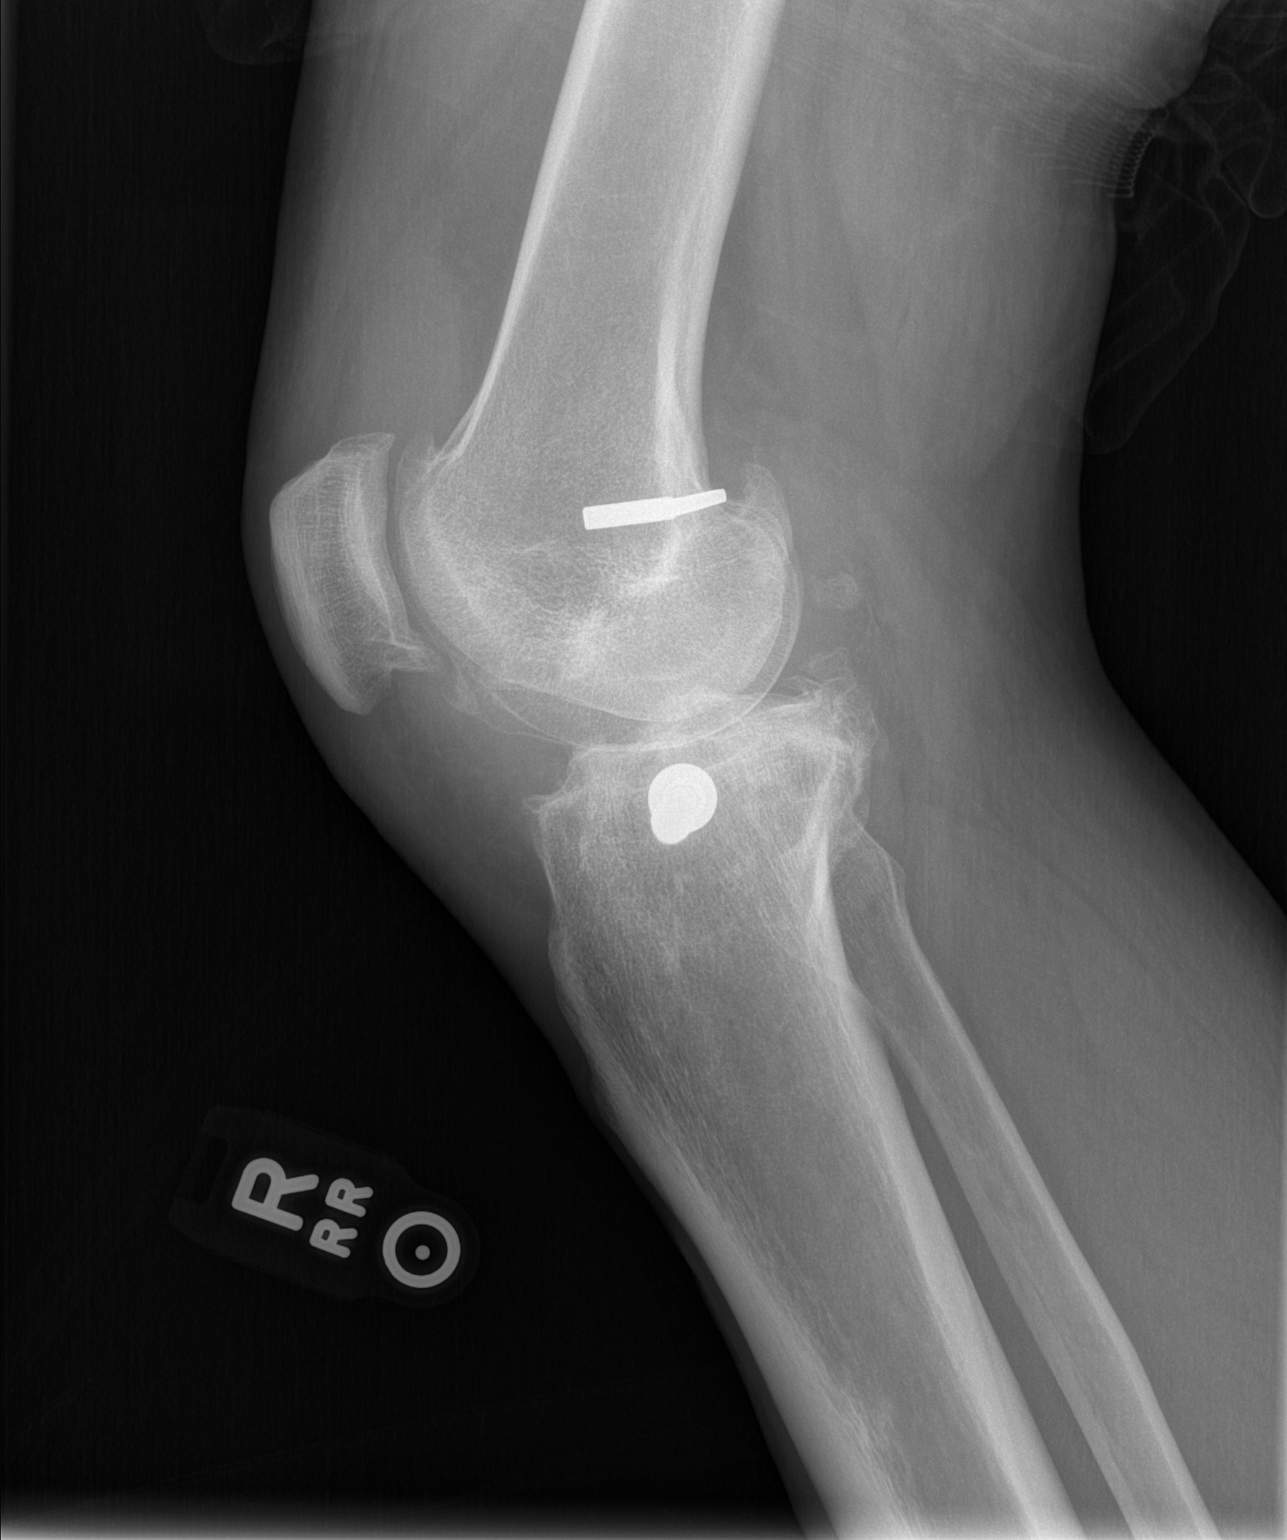

[2 of 2 positions shown; findings below may reference images not displayed]

FINDINGS: Stable postsurgical changes are noted compared to prior exam. Severe
degenerative joint disease is noted laterally with osteophyte
formation which is increased compared to prior exam Mild
degenerative joint disease is noted medially. Moderate narrowing of
patellofemoral space is noted. Mild suprapatellar joint effusion is
noted. No fracture or dislocation is noted.
IMPRESSION: Moderate to severe tricompartmental degenerative joint disease is
noted. No fracture or dislocation is noted. Mild suprapatellar joint
effusion is noted.

## 2016-09-02 ENCOUNTER — Emergency Department (HOSPITAL_BASED_OUTPATIENT_CLINIC_OR_DEPARTMENT_OTHER)
Admission: EM | Admit: 2016-09-02 | Discharge: 2016-09-02 | Disposition: A | Discharge status: Home / Self Care | Payer: Self-pay | Attending: Emergency Medicine | Admitting: Emergency Medicine

## 2016-09-02 ENCOUNTER — Encounter (HOSPITAL_BASED_OUTPATIENT_CLINIC_OR_DEPARTMENT_OTHER): Payer: Self-pay | Admitting: *Deleted

## 2016-09-02 DIAGNOSIS — M10062 Idiopathic gout, left knee: Secondary | ICD-10-CM | POA: Insufficient documentation

## 2016-09-02 DIAGNOSIS — Z79899 Other long term (current) drug therapy: Secondary | ICD-10-CM | POA: Insufficient documentation

## 2016-09-02 MED ORDER — PREDNISONE 20 MG PO TABS: 20.0000 mg | ORAL_TABLET | Freq: Two times a day (BID) | ORAL | 0 refills | Status: DC

## 2016-09-02 MED ORDER — LIDOCAINE HCL 2 % IJ SOLN
10.0000 mL | Freq: Once | INTRAMUSCULAR | Status: AC
  Administered 2016-09-02: 200 mg via INTRADERMAL
  Filled 2016-09-02: qty 20

## 2016-09-02 MED ORDER — COLCHICINE 0.6 MG PO TABS: 0.6000 mg | ORAL_TABLET | Freq: Every day | ORAL | 1 refills | Status: DC

## 2016-09-02 MED ORDER — AMLODIPINE BESYLATE 10 MG PO TABS: 10.0000 mg | ORAL_TABLET | Freq: Every day | ORAL | 0 refills | Status: DC

## 2016-09-02 MED ORDER — BUPIVACAINE HCL 0.5 % IJ SOLN
5.0000 mL | Freq: Once | INTRAMUSCULAR | Status: DC
  Filled 2016-09-02: qty 1

## 2016-09-02 NOTE — ED Notes (Signed)
ED Provider at bedside. 

## 2016-09-02 NOTE — ED Provider Notes (Addendum)
[facility] Provider Note   CSN: [id number] Arrival date & time: 09/02/16  1018     History   Chief Complaint Chief Complaint  Patient presents with  . Joint Swelling    HPI Lawrence [name] is a 51 y.o. male.  The history is provided by the patient.  Knee Pain   This is a recurrent problem. The current episode started 2 days ago. The problem occurs constantly. The problem has been gradually worsening. Pain location: left knee. The pain is moderate. Associated symptoms include limited range of motion. Pertinent negatives include no numbness. He has tried OTC pain medications for the symptoms. The treatment provided no relief. There has been no history of extremity trauma. Family history is significant for gout (similar to prior flares. Has been out of his Alopurinol for a few weeks).     Past Medical History:  Diagnosis Date  . Gout   . Knee effusion, left     Patient Active Problem List   Diagnosis Date Noted  . Bilateral ankle pain 11/29/2015  . Effusion of right knee 03/18/2014    Past Surgical History:  Procedure Laterality Date  . ANTERIOR CRUCIATE LIGAMENT REPAIR         Home Medications    Prior to Admission medications   Medication Sig Start Date End Date Taking? Authorizing Provider  allopurinol (ZYLOPRIM) 100 MG tablet Take 1 tablet (100 mg total) by mouth daily. 04/28/16  Yes Long, Arlyss RepressJoshua G, MD  indomethacin (INDOCIN) 25 MG capsule Take 1 capsule (25 mg total) by mouth 3 (three) times daily as needed. 04/28/16  Yes Long, Arlyss RepressJoshua G, MD  amLODipine (NORVASC) 10 MG tablet Take 1 tablet (10 mg total) by mouth daily. 09/02/16 10/02/16  Nira Connardama, Pedro Eduardo, MD  cephALEXin (KEFLEX) 500 MG capsule Take 1 capsule (500 mg total) by mouth 4 (four) times daily. 11/18/15   Danelle Berryapia, Leisa, PA-C  colchicine 0.6 MG tablet Take 1 tablet (0.6 mg total) by mouth daily. 09/02/16   Nira Connardama, Pedro Eduardo, MD  diphenhydrAMINE (BENADRYL) 25 MG tablet Take 2 tablets (50  mg total) by mouth every 8 (eight) hours as needed for itching. 07/25/15   Linwood DibblesKnapp, Jon, MD  Diphenhydramine-Acetaminophen (TYLENOL COLD RELIEF PO) Take 5 mLs by mouth every 4 (four) hours as needed. For cold    [provider]  hydrochlorothiazide (HYDRODIURIL) 25 MG tablet Take 1 tablet (25 mg total) by mouth daily. 03/07/15   Cartner, Sharlet SalinaBenjamin, PA-C  HYDROcodone-acetaminophen (NORCO/VICODIN) 5-325 MG tablet Take 1 tablet by mouth every 6 (six) hours as needed. 04/28/16   Long, Arlyss RepressJoshua G, MD  oxyCODONE-acetaminophen (PERCOCET/ROXICET) 5-325 MG tablet Take 2 tablets by mouth every 4 (four) hours as needed. 05/30/16   Rolland PorterJames, Mark, MD  predniSONE (DELTASONE) 20 MG tablet Take 1 tablet (20 mg total) by mouth 2 (two) times daily with a meal. 09/02/16   Cardama, Amadeo GarnetPedro Eduardo, MD  traMADol (ULTRAM) 50 MG tablet Take 1 tablet (50 mg total) by mouth every 6 (six) hours as needed. 02/26/16   Janne NapoleonNeese, Hope M, NP    Family History No family history on file.  Social History Social History  Substance Use Topics  . Smoking status: Never Smoker  . Smokeless tobacco: Never Used  . Alcohol use 1.8 oz/week    3 Cans of beer per week     Comment: daily     Allergies   Patient has no known allergies.   Review of Systems Review of Systems  Constitutional: Negative  for chills, diaphoresis and fever.  Neurological: Negative for numbness.  All other systems are reviewed and are negative for acute change except as noted in the HPI    Physical Exam Updated Vital Signs BP (!) 168/112 (BP Location: Left Arm)   Pulse 88   Temp 98.3 F (36.8 C) (Oral)   Resp 16   Ht 5\' 9"  (1.753 m)   Wt 93 kg (205 lb)   SpO2 100%   BMI 30.27 kg/m   Physical Exam  Constitutional: He is oriented to person, place, and time. He appears well-developed and well-nourished. No distress.  HENT:  Head: Normocephalic and atraumatic.  Right Ear: External ear normal.  Left Ear: External ear normal.  Nose: Nose normal.    Mouth/Throat: Mucous membranes are normal. No trismus in the jaw.  Eyes: Conjunctivae and EOM are normal. No scleral icterus.  Neck: Normal range of motion and phonation normal.  Cardiovascular: Normal rate and regular rhythm.   Pulmonary/Chest: Effort normal. No stridor. No respiratory distress.  Abdominal: He exhibits no distension.  Musculoskeletal: He exhibits no edema.       Left knee: He exhibits decreased range of motion and effusion. He exhibits no deformity and no erythema. Tenderness found.  Neurological: He is alert and oriented to person, place, and time.  Skin: He is not diaphoretic.  Psychiatric: He has a normal mood and affect. His behavior is normal.  Vitals reviewed.    ED Treatments / Results  Labs (all labs ordered are listed, but only abnormal results are displayed) Labs Reviewed - No data to display  EKG  EKG Interpretation None       Radiology No results found.  Procedures .Joint Aspiration/Arthrocentesis Date/Time: 09/02/2016 11:15 AM Performed by: Nira Conn Authorized by: Nira Conn   Consent:    Consent obtained:  Verbal   Risks discussed:  Bleeding, infection and incomplete drainage   Alternatives discussed:  Observation Location:    Location:  Knee   Knee:  L knee Anesthesia (see MAR for exact dosages):    Anesthesia method:  Local infiltration   Local anesthetic:  Lidocaine 2% w/o epi Procedure details:    Preparation: Patient was prepped and draped in usual sterile fashion     Needle gauge:  18 G   Ultrasound guidance: no     Approach:  Lateral   Aspirate amount:  60cc   Aspirate characteristics:  Clear   Steroid injected: no   Post-procedure details:    Dressing:  Adhesive bandage   Patient tolerance of procedure:  Tolerated well, no immediate complications   (including critical care time)  Medications Ordered in ED Medications  lidocaine (XYLOCAINE) 2 % (with pres) injection 200 mg (200 mg  Intradermal Given by Other 09/02/16 1057)     Initial Impression / Assessment and Plan / ED Course  I have reviewed the triage vital signs and the nursing notes.  Pertinent labs & imaging results that were available during my care of the patient were reviewed by me and considered in my medical decision making (see chart for details).     Gout flare. Therapeutic arthrocentesis performed. Pt recently had steroid injection w/in 6 months; not a candidate for repeat injection at this time.   Low suspicion for septic arthritis. Will treat with colchicine.   The patient is safe for discharge with strict return precautions.   Final Clinical Impressions(s) / ED Diagnoses   Final diagnoses:  Acute idiopathic gout of left knee  Disposition: Discharge  Condition: Good  I have discussed the results, Dx and Tx plan with the patient who expressed understanding and agree(s) with the plan. Discharge instructions discussed at great length. The patient was given strict return precautions who verbalized understanding of the instructions. No further questions at time of discharge.    Current Discharge Medication List      Follow Up: Tarri FullerEscajeda, [place], [place] [id number] [place] [id number] [phone]  Schedule an appointment as soon as possible for a visit        Cardama, Amadeo GarnetPedro Eduardo, MD 09/02/16 1201

## 2016-09-02 NOTE — ED Triage Notes (Signed)
Pt presents with L knee swelling (hx of gout). Pt ambulating with a cane. Denies fever, numbness/tingling. Pt reports he's out of all his medications, including BP meds.

## 2016-12-17 ENCOUNTER — Other Ambulatory Visit: Payer: Self-pay

## 2016-12-17 ENCOUNTER — Encounter (HOSPITAL_BASED_OUTPATIENT_CLINIC_OR_DEPARTMENT_OTHER): Payer: Self-pay | Admitting: Emergency Medicine

## 2016-12-17 ENCOUNTER — Emergency Department (HOSPITAL_BASED_OUTPATIENT_CLINIC_OR_DEPARTMENT_OTHER)
Admission: EM | Admit: 2016-12-17 | Discharge: 2016-12-17 | Disposition: A | Discharge status: Home / Self Care | Payer: Self-pay | Attending: Emergency Medicine | Admitting: Emergency Medicine

## 2016-12-17 DIAGNOSIS — Z79899 Other long term (current) drug therapy: Secondary | ICD-10-CM | POA: Insufficient documentation

## 2016-12-17 DIAGNOSIS — M10031 Idiopathic gout, right wrist: Secondary | ICD-10-CM | POA: Insufficient documentation

## 2016-12-17 MED ORDER — PREDNISONE 20 MG PO TABS: 40.0000 mg | ORAL_TABLET | Freq: Every day | ORAL | 0 refills | Status: AC

## 2016-12-17 MED ORDER — OXYCODONE-ACETAMINOPHEN 5-325 MG PO TABS: 1.0000 | ORAL_TABLET | Freq: Four times a day (QID) | ORAL | 0 refills | Status: DC | PRN

## 2016-12-17 MED ORDER — METHYLPREDNISOLONE SODIUM SUCC 125 MG IJ SOLR
125.0000 mg | Freq: Once | INTRAMUSCULAR | Status: AC
  Administered 2016-12-17: 125 mg via INTRAMUSCULAR
  Filled 2016-12-17: qty 2

## 2016-12-17 NOTE — ED Triage Notes (Addendum)
PT presents with c/o right wrist pain for over a week but worse today. PT reports a history of gout in right wrist. No injury to wrist per patient.

## 2016-12-17 NOTE — ED Notes (Signed)
Alert, NAD, calm, interactive, resps e/u, speaking in clear complete sentences, no dyspnea noted, skin W&D, initial VSS/ BP elevated,R posterior ulnar wrist hot red and swollen, h/o gout, c/w same, out of meds, no PCP, onset 1 week ago, gradually worse (denies: sob, nausea, numbness, tingling, dizziness or visual changes). Has taken prednisone, allopurinol, colchicine, indomethacin before.

## 2016-12-17 NOTE — ED Provider Notes (Signed)
[facility] Provider Note: Lowella DellJ. Lane Braylen Staller, MD, FACEP  CSN: [id number] MRN: [id number] ARRIVAL: 12/17/16 at 0027 ROOM: [facility]   CHIEF COMPLAINT  Wrist Pain   HISTORY OF PRESENT ILLNESS  12/17/16 12:57 AM Lawrence [name] is a 51 y.o. male with a history of gout.  He is here with pain in his right wrist that began yesterday morning and worsened throughout the day.  He now rates the pain in his right wrist as a 10 out of 10.  It is worse with attempted movement or palpation.  Range of motion is decreased and there is associated swelling and mild warmth.  He characterizes the pain is like previous gout attacks.  He is out of gout medications.  He has been applying ice without relief.   Past Medical History:  Diagnosis Date  . Gout   . Knee effusion, left     Past Surgical History:  Procedure Laterality Date  . ANTERIOR CRUCIATE LIGAMENT REPAIR      No family history on file.  Social History   Tobacco Use  . Smoking status: Never Smoker  . Smokeless tobacco: Never Used  Substance Use Topics  . Alcohol use: Yes    Alcohol/week: 1.8 oz    Types: 3 Cans of beer per week    Comment: daily  . Drug use: No    Prior to Admission medications   Medication Sig Start Date End Date Taking? Authorizing Provider  allopurinol (ZYLOPRIM) 100 MG tablet Take 1 tablet (100 mg total) by mouth daily. 04/28/16   Long, Arlyss RepressJoshua G, MD  amLODipine (NORVASC) 10 MG tablet Take 1 tablet (10 mg total) by mouth daily. 09/02/16 10/02/16  Nira Connardama, Pedro Eduardo, MD  cephALEXin (KEFLEX) 500 MG capsule Take 1 capsule (500 mg total) by mouth 4 (four) times daily. 11/18/15   Danelle Berryapia, Leisa, PA-C  colchicine 0.6 MG tablet Take 1 tablet (0.6 mg total) by mouth daily. 09/02/16   Nira Connardama, Pedro Eduardo, MD  diphenhydrAMINE (BENADRYL) 25 MG tablet Take 2 tablets (50 mg total) by mouth every 8 (eight) hours as needed for itching. 07/25/15   Linwood DibblesKnapp, Jon, MD  Diphenhydramine-Acetaminophen (TYLENOL COLD RELIEF  PO) Take 5 mLs by mouth every 4 (four) hours as needed. For cold    [provider]  hydrochlorothiazide (HYDRODIURIL) 25 MG tablet Take 1 tablet (25 mg total) by mouth daily. 03/07/15   Cartner, Sharlet SalinaBenjamin, PA-C  HYDROcodone-acetaminophen (NORCO/VICODIN) 5-325 MG tablet Take 1 tablet by mouth every 6 (six) hours as needed. 04/28/16   Long, Arlyss RepressJoshua G, MD  indomethacin (INDOCIN) 25 MG capsule Take 1 capsule (25 mg total) by mouth 3 (three) times daily as needed. 04/28/16   Long, Arlyss RepressJoshua G, MD  oxyCODONE-acetaminophen (PERCOCET/ROXICET) 5-325 MG tablet Take 2 tablets by mouth every 4 (four) hours as needed. 05/30/16   Rolland PorterJames, Mark, MD  predniSONE (DELTASONE) 20 MG tablet Take 1 tablet (20 mg total) by mouth 2 (two) times daily with a meal. 09/02/16   Cardama, Amadeo GarnetPedro Eduardo, MD  traMADol (ULTRAM) 50 MG tablet Take 1 tablet (50 mg total) by mouth every 6 (six) hours as needed. 02/26/16   Janne NapoleonNeese, Hope M, NP    Allergies Patient has no known allergies.   REVIEW OF SYSTEMS  Negative except as noted here or in the History of Present Illness.   PHYSICAL EXAMINATION  Initial Vital Signs Blood pressure (!) 188/116, pulse 94, temperature 98.4 F (36.9 C), temperature source Oral, resp. rate 18, SpO2 100 %.  Examination  General: Well-developed, well-nourished male in no acute distress; appearance consistent with age of record HENT: normocephalic; atraumatic Eyes: Normal appearance Neck: supple Heart: regular rate and rhythm Lungs: clear to auscultation bilaterally Abdomen: soft; nondistended; nontender; bowel sounds present Extremities: No deformity; tenderness, mild edema and mild warmth of right wrist with decreased range of motion, right hand distally neurovascularly intact Neurologic: Awake, alert and oriented; motor function intact in all extremities and symmetric; no facial droop Skin: Warm and dry Psychiatric: Normal mood and affect   RESULTS  Summary of this visit's results, reviewed by  myself:   EKG Interpretation  Date/Time:    Ventricular Rate:    PR Interval:    QRS Duration:   QT Interval:    QTC Calculation:   R Axis:     Text Interpretation:        Laboratory Studies: No results found for this or any previous visit (from the past 24 hour(s)). Imaging Studies: No results found.  ED COURSE  Nursing notes and initial vitals signs, including pulse oximetry, reviewed.  Vitals:   12/17/16 0032  BP: (!) 188/116  Pulse: 94  Resp: 18  Temp: 98.4 F (36.9 C)  TempSrc: Oral  SpO2: 100%    PROCEDURES    ED DIAGNOSES     ICD-10-CM   1. Acute idiopathic gout of right wrist M10.031        Lawrence [name], Jonny RuizJohn, MD 12/17/16 0111

## 2016-12-17 NOTE — ED Notes (Signed)
EDP into room 

## 2016-12-17 NOTE — ED Notes (Signed)
No changes. "feel better with splint on". CMS remains intact s/p velcro R wrist splint placement.

## 2017-02-02 ENCOUNTER — Encounter (HOSPITAL_BASED_OUTPATIENT_CLINIC_OR_DEPARTMENT_OTHER): Payer: Self-pay | Admitting: *Deleted

## 2017-02-02 ENCOUNTER — Other Ambulatory Visit: Payer: Self-pay

## 2017-02-02 ENCOUNTER — Emergency Department (HOSPITAL_BASED_OUTPATIENT_CLINIC_OR_DEPARTMENT_OTHER)
Admission: EM | Admit: 2017-02-02 | Discharge: 2017-02-02 | Discharge status: Against Medical Advice | Payer: Self-pay | Attending: Emergency Medicine | Admitting: Emergency Medicine

## 2017-02-02 DIAGNOSIS — M25562 Pain in left knee: Secondary | ICD-10-CM | POA: Insufficient documentation

## 2017-02-02 DIAGNOSIS — Z5321 Procedure and treatment not carried out due to patient leaving prior to being seen by health care provider: Secondary | ICD-10-CM | POA: Insufficient documentation

## 2017-02-02 NOTE — ED Notes (Signed)
Pt states he may leave and come back in the am.

## 2017-02-02 NOTE — ED Notes (Signed)
Pt. Lawrence SpanielSaid he was leaving due to his son needed to be somewhere.  Pt. Was encouraged to stay but wanted to leave.  Pt. Lawrence SpanielSaid he would return tomorrow morning.

## 2017-02-02 NOTE — ED Triage Notes (Signed)
Left knee is swollen and painful for a week. States he has a hx of gout.

## 2017-02-03 ENCOUNTER — Emergency Department (HOSPITAL_BASED_OUTPATIENT_CLINIC_OR_DEPARTMENT_OTHER)
Admission: EM | Admit: 2017-02-03 | Discharge: 2017-02-03 | Disposition: A | Discharge status: Home / Self Care | Payer: Self-pay | Attending: Emergency Medicine | Admitting: Emergency Medicine

## 2017-02-03 ENCOUNTER — Encounter (HOSPITAL_BASED_OUTPATIENT_CLINIC_OR_DEPARTMENT_OTHER): Payer: Self-pay | Admitting: Emergency Medicine

## 2017-02-03 ENCOUNTER — Other Ambulatory Visit: Payer: Self-pay

## 2017-02-03 DIAGNOSIS — M109 Gout, unspecified: Secondary | ICD-10-CM | POA: Insufficient documentation

## 2017-02-03 MED ORDER — NAPROXEN 500 MG PO TABS: 500.0000 mg | ORAL_TABLET | Freq: Two times a day (BID) | ORAL | 0 refills | Status: DC

## 2017-02-03 MED ORDER — DEXAMETHASONE SODIUM PHOSPHATE 10 MG/ML IJ SOLN
10.0000 mg | Freq: Once | INTRAMUSCULAR | Status: AC
  Administered 2017-02-03: 10 mg via INTRAMUSCULAR
  Filled 2017-02-03: qty 1

## 2017-02-03 NOTE — ED Notes (Signed)
Patient is A & O x4.  He understood discharge instructions 

## 2017-02-03 NOTE — ED Triage Notes (Signed)
Patient is complaining of bilateral swollen knee pain x one week.  States he has a hx of gout.   Complaining of no other pain.

## 2017-02-03 NOTE — Discharge Instructions (Signed)
You were see in the emergency department and diagnosed with Gout.  You received a shot of a steroid while in the emergency department.  I am sending you home with a prescription for naproxen, this is a nonsteroidal anti-inflammatory medication, take this once every 12 hours until your gout flare has resolved, then proceed to take it for another 2-3 days.  Take this medication with food as it may upset your stomach, and at worst cause stomach bleeding. Do not take other non steroidal anti-inflammatory medicines such as Aleve, Motrin, or Advil when taking this medicine as it will further irritate your stomach.   May supplement with Tylenol. With Dr. Pearletha ForgeHudnall in the next 1 week for reevaluation.  Additionally your blood pressure is elevated in the emergency department today, upon review of your previous visits it seems to be consistently elevated.  It is extremely important that you follow-up with a primary care provider in the next 1 week for recheck of this and possible initiation of an antihypertensive medication.  Return to the emergency department for any new or worsening symptoms including but not limited to fever, chills, worsening of pain or swelling, headache, change in your vision, chest pain, shortness of breath, numbness, or weakness.

## 2017-02-03 NOTE — ED Provider Notes (Signed)
[facility] EMERGENCY DEPARTMENT Provider Note   CSN: [id number] Arrival date & time: 02/03/17  [id number]     History   Chief Complaint Chief Complaint  Patient presents with  . Leg Pain    HPI Lawrence [name] is a 51 y.o. male with a history of gout who presents to the emergency department complaining of constant pain in multiple joints x 2 days.  Patient states that he is having pain in bilateral knees and bilateral ankles, pain is most significant in his left knee.  He also is having associated swelling in the left knee.  Patient states this feels similar to previous gout attacks.  Gout has been confirmed on synovial fluid analysis previously.  Patient states pain is worse with walking, has tried Tylenol without relief.  No other significant alleviating or aggravating factors.  Denies fever, chills, numbness, weakness, or any recent injury or surgical procedures. Denies dysuria, abdominal pain, or N/V/D.   HPI  Past Medical History:  Diagnosis Date  . Gout   . Knee effusion, left     Patient Active Problem List   Diagnosis Date Noted  . Bilateral ankle pain 11/29/2015  . Effusion of right knee 03/18/2014    Past Surgical History:  Procedure Laterality Date  . ANTERIOR CRUCIATE LIGAMENT REPAIR         Home Medications    Prior to Admission medications   Medication Sig Start Date End Date Taking? Authorizing Provider  naproxen (NAPROSYN) 500 MG tablet Take 1 tablet (500 mg total) by mouth 2 (two) times daily. 02/03/17   Eustace Hur, Pleas KochSamantha R, PA-C  oxyCODONE-acetaminophen (PERCOCET) 5-325 MG tablet Take 1 tablet every 6 (six) hours as needed by mouth for severe pain. 12/17/16   Molpus, Jonny RuizJohn, MD    Family History History reviewed. No pertinent family history.  Social History Social History   Tobacco Use  . Smoking status: Never Smoker  . Smokeless tobacco: Never Used  Substance Use Topics  . Alcohol use: Yes    Alcohol/week: 1.8 oz    Types: 3 Cans  of beer per week    Comment: daily  . Drug use: No     Allergies   Patient has no known allergies.   Review of Systems Review of Systems  Constitutional: Negative for chills and fever.  Eyes: Negative for visual disturbance.  Musculoskeletal: Positive for arthralgias (bilateral knees and ankles) and joint swelling (L knee).  Neurological: Negative for dizziness, weakness, light-headedness, numbness and headaches.    Physical Exam Updated Vital Signs BP (!) 163/103 (BP Location: Left Arm)   Pulse 88   Temp 98.9 F (37.2 C) (Oral)   Resp 18   Ht 5\' 9"  (1.753 m)   Wt 93 kg (205 lb)   SpO2 100%   BMI 30.27 kg/m   Physical Exam  Constitutional: He appears well-developed and well-nourished.  Non-toxic appearance. No distress.  HENT:  Head: Normocephalic and atraumatic.  Eyes: Conjunctivae are normal. Right eye exhibits no discharge. Left eye exhibits no discharge.  Cardiovascular:  Pulses:      Dorsalis pedis pulses are 2+ on the right side, and 2+ on the left side.  Musculoskeletal:  Lower extremities: L knee moderate effusion. Otherwise no appreciable swelling to the lower extremities. No warmth. No erythema, fluctuance, or open wounds. Patient has full ROM to bilateral hips, knees, and ankles. Has some stiffness discomfort in L knee with flexion of the knee. Mild diffuse L knee tenderness, non focal. No bony tenderness  otherwise. Specifically ankles and R knee are completely non tender.   Neurological: He is alert.  Clear speech. 5/5 strength with knee flexion/extension and ankle plantar/dorsi flexion bilaterally. Sensation grossly intact to bilateral lower extremities. Gait intact.   Psychiatric: He has a normal mood and affect. His behavior is normal. Thought content normal.  Nursing note and vitals reviewed.   ED Treatments / Results  Labs (all labs ordered are listed, but only abnormal results are displayed) Labs Reviewed - No data to display  EKG  EKG  Interpretation None      Radiology No results found.  Procedures Procedures (including critical care time)  Medications Ordered in ED Medications  dexamethasone (DECADRON) injection 10 mg (not administered)    Initial Impression / Assessment and Plan / ED Course  I have reviewed the triage vital signs and the nursing notes.  Pertinent labs & imaging results that were available during my care of the patient were reviewed by me and considered in my medical decision making (see chart for details).  Patient presents with pain in multiple joints, most significant in left knee with associated L knee swelling.  Patient reports similar to previous gout attacks, has had gout confirmed on previous synovial fluid analysis.  Patient is nontoxic-appearing, in no apparent distress. He is afebrile and without recent surgical procedures or open wounds, doubt septic joint. There is a L knee effusion with diffuse tenderness to palpation and stiffness with knee flexion. Patient is NVI distally. Will treat with Decadron IM in the ED and discharge home with prescription for Naproxen and follow up with ortho. Patient's blood pressure is elevated in the emergency department today, upon review of previous visits this seems to be frequently elevated. Patient denies headache, change in vision, numbness, weakness, chest pain, dyspnea, dizziness, or lightheadedness therefore doubt hypertensive emergency. Discussed elevated blood pressure with the patient and the need for primary care follow up with potential need to initiate antihypertensive medications and or for further evaluation. Discussed return precaution signs/symptoms for hypertensive emergency as listed above with the patient. He confirmed understanding.    I discussed treatment plan, need for PCP and ortho follow-up, and return precautions with the patient. Provided opportunity for questions, patient confirmed understanding and is in agreement with plan.     Final Clinical Impressions(s) / ED Diagnoses   Final diagnoses:  Acute gout of left knee, unspecified cause    ED Discharge Orders        Ordered    naproxen (NAPROSYN) 500 MG tablet  2 times daily     02/03/17 [address]       Nesanel Aguila, Hope MillsSamantha R, PA-C 02/03/17 1026    Maia PlanLong, Joshua G, MD 02/03/17 1043

## 2017-02-25 ENCOUNTER — Other Ambulatory Visit: Payer: Self-pay

## 2017-02-25 ENCOUNTER — Encounter (HOSPITAL_BASED_OUTPATIENT_CLINIC_OR_DEPARTMENT_OTHER): Payer: Self-pay | Admitting: Emergency Medicine

## 2017-02-25 ENCOUNTER — Emergency Department (HOSPITAL_BASED_OUTPATIENT_CLINIC_OR_DEPARTMENT_OTHER)
Admission: EM | Admit: 2017-02-25 | Discharge: 2017-02-25 | Disposition: A | Discharge status: Home / Self Care | Payer: Self-pay | Attending: Emergency Medicine | Admitting: Emergency Medicine

## 2017-02-25 DIAGNOSIS — Z79899 Other long term (current) drug therapy: Secondary | ICD-10-CM | POA: Insufficient documentation

## 2017-02-25 DIAGNOSIS — M25462 Effusion, left knee: Secondary | ICD-10-CM | POA: Insufficient documentation

## 2017-02-25 LAB — GRAM STAIN

## 2017-02-25 LAB — SYNOVIAL CELL COUNT + DIFF, W/ CRYSTALS
EOSINOPHILS-SYNOVIAL: 0 % (ref 0–1)
LYMPHOCYTES-SYNOVIAL FLD: 3 % (ref 0–20)
MONOCYTE-MACROPHAGE-SYNOVIAL FLUID: 2 % — AB (ref 50–90)
NEUTROPHIL, SYNOVIAL: 95 % — AB (ref 0–25)
WBC, SYNOVIAL: 5722 /mm3 — AB (ref 0–200)

## 2017-02-25 MED ORDER — NAPROXEN 500 MG PO TABS: 500.0000 mg | ORAL_TABLET | Freq: Two times a day (BID) | ORAL | 0 refills | Status: AC

## 2017-02-25 MED ORDER — LIDOCAINE HCL 2 % IJ SOLN
10.0000 mL | Freq: Once | INTRAMUSCULAR | Status: AC
  Administered 2017-02-25: 200 mg
  Filled 2017-02-25: qty 20

## 2017-02-25 MED ORDER — KETOROLAC TROMETHAMINE 30 MG/ML IJ SOLN
30.0000 mg | Freq: Once | INTRAMUSCULAR | Status: AC
  Administered 2017-02-25: 30 mg via INTRAMUSCULAR
  Filled 2017-02-25: qty 1

## 2017-02-25 MED ORDER — LIDOCAINE-EPINEPHRINE 2 %-1:100000 IJ SOLN: 20.0000 mL | Freq: Once | INTRAMUSCULAR | Status: DC

## 2017-02-25 NOTE — Discharge Instructions (Signed)
Please read attached information regarding your condition.   Take naproxen as needed for pain. We will contact you with results of your joint analysis when it is available. Follow-up at your PCPs office for further evaluation for further aspiration. Return to ED for worsening symptoms, injuries or falls, red hot or tender joint, numbness in legs.

## 2017-02-25 NOTE — ED Notes (Signed)
Received call from lab, unable to do cell count on synovial fluid, due to being clotted off . ED provider informed

## 2017-02-25 NOTE — ED Triage Notes (Signed)
L knee pain for "awhile", hx of gout in that knee. Swelling for 2 days.

## 2017-02-25 NOTE — ED Provider Notes (Signed)
Medical screening examination/treatment/procedure(s) were conducted as a shared visit with non-physician practitioner(s) and myself.  I personally evaluated the patient during the encounter.   EKG Interpretation None       patient presents requesting arthrocentesis for therapeutic relief.  There is no erythema but it is mildly warm and significantly swollen with effusion.  Due to this, after consent, arthrocentesis obtained.  It was fairly difficult but we were able to get out 50 mL.  Unfortunately the specimen had some blood and clotted so they cannot get a cell count.  My suspicion is this is most likely gout given how many times this is recurred with him.  Sent for Gram stain and culture and he will need to follow-up with Dr. Pearletha ForgeHudnall.  ARTHOCENTESIS Performed by: Audree CamelScott T Garry Bochicchio Consent: Verbal consent obtained. Risks and benefits: risks, benefits and alternatives were discussed Consent given by: patient Required items: required blood products, implants, devices, and special equipment available Patient identity confirmed: verbally with patient Time out: Immediately prior to procedure a "time out" was called to verify the correct patient, procedure, equipment, support staff and site/side marked as required. Indications: left knee effusion; therapeutic, r/o septic joint Joint: left knee Local anesthesia used: lidocaine 2% w/o epi Preparation: Patient was prepped and draped in the usual sterile fashion. Aspirate appearance: bloody, yellow Aspirate amount: 50 ml Patient tolerance: Patient tolerated the procedure well with no immediate complications.      Pricilla LovelessGoldston, Kannon Baum, MD 02/25/17 [phone]

## 2017-02-25 NOTE — ED Provider Notes (Signed)
[facility] EMERGENCY DEPARTMENT Provider Note   CSN: [id number] Arrival date & time: 02/25/17  1028     History   Chief Complaint Chief Complaint  Patient presents with  . Knee Pain    HPI Lawrence [name] is a 52 y.o. male with a past medical history of gout, who presents to ED for evaluation of progressively worsening left knee pain and swelling.  He states that he was seen here approximately 3 weeks ago for similar symptoms.  Usually his symptoms resolve with joint aspiration however, effusion was not very large on his last visit so aspiration was not done.  He states that since then the swelling has gotten worse.  States that the pain is worse with movement and palpation which he states is consistent with his previous gout flareups.  He is not on any daily medications for gout.  He denies any numbness in legs, fevers, injury or trauma to site, recent surgeries, recent prolonged travel, history of blood clots.  HPI  Past Medical History:  Diagnosis Date  . Gout   . Knee effusion, left     Patient Active Problem List   Diagnosis Date Noted  . Bilateral ankle pain 11/29/2015  . Effusion of right knee 03/18/2014    Past Surgical History:  Procedure Laterality Date  . ANTERIOR CRUCIATE LIGAMENT REPAIR         Home Medications    Prior to Admission medications   Medication Sig Start Date End Date Taking? Authorizing Provider  naproxen (NAPROSYN) 500 MG tablet Take 1 tablet (500 mg total) by mouth 2 (two) times daily. 02/25/17   Kehinde Totzke, PA-C  oxyCODONE-acetaminophen (PERCOCET) 5-325 MG tablet Take 1 tablet every 6 (six) hours as needed by mouth for severe pain. 12/17/16   Molpus, John, MD    Family History No family history on file.  Social History Social History   Tobacco Use  . Smoking status: Never Smoker  . Smokeless tobacco: Never Used  Substance Use Topics  . Alcohol use: Yes    Alcohol/week: 1.8 oz    Types: 3 Cans of beer per week   Comment: daily  . Drug use: No     Allergies   Patient has no known allergies.   Review of Systems Review of Systems  Constitutional: Negative for chills and fever.  Cardiovascular: Negative for chest pain.  Gastrointestinal: Negative for nausea and vomiting.  Musculoskeletal: Positive for arthralgias and joint swelling. Negative for back pain, gait problem, myalgias and neck pain.  Skin: Positive for color change.     Physical Exam Updated Vital Signs BP (!) 184/112 (BP Location: Right Arm)   Pulse (!) 104   Temp 98.4 F (36.9 C) (Oral)   Resp 18   Ht 5\' 9"  (1.753 m)   Wt 93 kg (205 lb)   SpO2 100%   BMI 30.27 kg/m   Physical Exam  Constitutional: He appears well-developed and well-nourished. No distress.  HENT:  Head: Normocephalic and atraumatic.  Eyes: Conjunctivae and EOM are normal. No scleral icterus.  Neck: Normal range of motion.  Pulmonary/Chest: Effort normal. No respiratory distress.  Musculoskeletal: He exhibits edema and tenderness. He exhibits no deformity.  Edema surrounding the left knee as indicated in the image.  Tenderness to palpation of the joint with no warmth or color change noted.  Sensation intact light touch in bilateral lower extremities.  No calf tenderness noted.  Neurological: He is alert.  Skin: No rash noted. He is not  diaphoretic.  Psychiatric: He has a normal mood and affect.  Nursing note and vitals reviewed.      ED Treatments / Results  Labs (all labs ordered are listed, but only abnormal results are displayed) Labs Reviewed  SYNOVIAL CELL COUNT + DIFF, W/ CRYSTALS - Abnormal; Notable for the following components:      Result Value   Color, Synovial RED (*)    Appearance-Synovial TURBID (*)    WBC, Synovial 5,722 (*)    Neutrophil, Synovial 95 (*)    Monocyte-Macrophage-Synovial Fluid 2 (*)    All other components within normal limits  GRAM STAIN  CULTURE, BODY FLUID-BOTTLE    EKG  EKG Interpretation None         Radiology No results found.  Procedures .Joint Aspiration/Arthrocentesis Date/Time: 02/25/2017 11:04 AM Performed by: Dietrich Pates, PA-C Authorized by: Dietrich Pates, PA-C   Consent:    Consent obtained:  Verbal   Consent given by:  Patient   Risks discussed:  Bleeding, incomplete drainage, infection, nerve damage, pain and poor cosmetic result Location:    Location:  Knee   Knee:  L knee Anesthesia (see MAR for exact dosages):    Anesthesia method:  Local infiltration   Local anesthetic:  Lidocaine 2% w/o epi Procedure details:    Needle gauge:  18 G   Approach:  Lateral   Aspirate amount:  10ml   Aspirate characteristics:  Blood-tinged and serous   Specimen collected: yes   Post-procedure details:    Patient tolerance of procedure:  Tolerated well, no immediate complications    (including critical care time)  Medications Ordered in ED Medications  lidocaine (XYLOCAINE) 2 % (with pres) injection 200 mg (200 mg Infiltration Given 02/25/17 1059)  ketorolac (TORADOL) 30 MG/ML injection 30 mg (30 mg Intramuscular Given 02/25/17 1401)     Initial Impression / Assessment and Plan / ED Course  I have reviewed the triage vital signs and the nursing notes.  Pertinent labs & imaging results that were available during my care of the patient were reviewed by me and considered in my medical decision making (see chart for details).     Patient presents to ED for evaluation of progressively worsening left knee pain and swelling over the past several weeks.  Reports consistent with his gout flareups.  No injury or trauma to site, no fevers, no numbness to extremity states that every time he has a gout flareup, he has to have therapeutic arthrocentesis to relieve the pressure and pain from his knee.  This was attempted several times by me and Dr. Criss Alvine.  Dr. Criss Alvine was able to remove 50 ML of fluid from the joint.  This was sent for Gram stain and showed 5000 WBCs urate crystals.   We did have suspicion for gout rather than septic joint as a cause of his symptoms.  I did advise him to follow-up with Dr. Pearletha Forge for remainder of arthrocentesis if warranted.  Given pain medication here in the ED and discharged with anti-inflammatories.  Patient appears stable for discharge at this time.  Strict return precautions given.  Final Clinical Impressions(s) / ED Diagnoses   Final diagnoses:  Effusion of left knee    ED Discharge Orders        Ordered    naproxen (NAPROSYN) 500 MG tablet  2 times daily     02/25/17 1323     Portions of this note were generated with Dragon dictation software. Dictation errors may occur despite best  attempts at proofreading.    Dietrich Pates, PA-C 02/25/17 1655    Pricilla Loveless, MD 02/26/17 [phone]

## 2017-02-25 NOTE — ED Notes (Signed)
ED Provider at bedside. Performing aspiration

## 2017-03-02 LAB — CULTURE, BODY FLUID W GRAM STAIN -BOTTLE: Culture: NO GROWTH

## 2017-03-04 ENCOUNTER — Encounter (HOSPITAL_BASED_OUTPATIENT_CLINIC_OR_DEPARTMENT_OTHER): Payer: Self-pay | Admitting: Emergency Medicine

## 2017-03-04 ENCOUNTER — Other Ambulatory Visit: Payer: Self-pay

## 2017-03-04 ENCOUNTER — Emergency Department (HOSPITAL_BASED_OUTPATIENT_CLINIC_OR_DEPARTMENT_OTHER)
Admission: EM | Admit: 2017-03-04 | Discharge: 2017-03-04 | Disposition: A | Discharge status: Home / Self Care | Payer: Self-pay | Attending: Emergency Medicine | Admitting: Emergency Medicine

## 2017-03-04 DIAGNOSIS — M10062 Idiopathic gout, left knee: Secondary | ICD-10-CM | POA: Insufficient documentation

## 2017-03-04 MED ORDER — LIDOCAINE-EPINEPHRINE 2 %-1:100000 IJ SOLN
20.0000 mL | Freq: Once | INTRAMUSCULAR | Status: AC
  Administered 2017-03-04: 20 mL
  Filled 2017-03-04: qty 1

## 2017-03-04 MED ORDER — KETOROLAC TROMETHAMINE 30 MG/ML IJ SOLN
30.0000 mg | Freq: Once | INTRAMUSCULAR | Status: AC
  Administered 2017-03-04: 30 mg via INTRAMUSCULAR
  Filled 2017-03-04: qty 1

## 2017-03-04 MED ORDER — HYDROCODONE-ACETAMINOPHEN 5-325 MG PO TABS: 1.0000 | ORAL_TABLET | ORAL | 0 refills | Status: DC | PRN

## 2017-03-04 NOTE — ED Triage Notes (Signed)
Pt c/o continued LT knee pain and swelling; here on 1/20 for same

## 2017-03-04 NOTE — ED Provider Notes (Signed)
[facility] EMERGENCY DEPARTMENT Provider Note   CSN: [id number] Arrival date & time: 03/04/17  1724     History   Chief Complaint Chief Complaint  Patient presents with  . Knee Pain    HPI Lawrence [name] is a 52 y.o. male.  Pt presents to the ED today with left knee pain.  Pt has a hx of gout and this is similar to prior episodes.  The pt was here on 1/20 for the same.   They did drain his knee and sent it for cultures.  No bacteria grew.  Pt has tried to get into see his doctor, but has not been able to get an appt yet.  Pt said his knee is still hurting.      Past Medical History:  Diagnosis Date  . Gout   . Knee effusion, left     Patient Active Problem List   Diagnosis Date Noted  . Bilateral ankle pain 11/29/2015  . Effusion of right knee 03/18/2014    Past Surgical History:  Procedure Laterality Date  . ANTERIOR CRUCIATE LIGAMENT REPAIR         Home Medications    Prior to Admission medications   Medication Sig Start Date End Date Taking? Authorizing Provider  HYDROcodone-acetaminophen (NORCO/VICODIN) 5-325 MG tablet Take 1 tablet by mouth every 4 (four) hours as needed. 03/04/17   Jacalyn LefevreHaviland, Lindee Leason, MD  naproxen (NAPROSYN) 500 MG tablet Take 1 tablet (500 mg total) by mouth 2 (two) times daily. 02/25/17   Khatri, Hina, PA-C  oxyCODONE-acetaminophen (PERCOCET) 5-325 MG tablet Take 1 tablet every 6 (six) hours as needed by mouth for severe pain. 12/17/16   Molpus, John, MD    Family History No family history on file.  Social History Social History   Tobacco Use  . Smoking status: Never Smoker  . Smokeless tobacco: Never Used  Substance Use Topics  . Alcohol use: Yes    Alcohol/week: 1.8 oz    Types: 3 Cans of beer per week    Comment: daily  . Drug use: No     Allergies   Patient has no known allergies.   Review of Systems Review of Systems  Musculoskeletal:       Left knee pain/swelling  All other systems reviewed and are  negative.    Physical Exam Updated Vital Signs BP (!) 163/105 (BP Location: Right Arm)   Pulse 96   Temp 98.7 F (37.1 C) (Oral)   Resp 16   Ht 5\' 9"  (1.753 m)   Wt 93 kg (205 lb)   SpO2 100%   BMI 30.27 kg/m   Physical Exam  Constitutional: He is oriented to person, place, and time. He appears well-developed and well-nourished.  HENT:  Head: Normocephalic and atraumatic.  Right Ear: External ear normal.  Left Ear: External ear normal.  Nose: Nose normal.  Mouth/Throat: Oropharynx is clear and moist.  Eyes: Conjunctivae and EOM are normal. Pupils are equal, round, and reactive to light.  Neck: Normal range of motion. Neck supple.  Cardiovascular: Normal rate, regular rhythm, normal heart sounds and intact distal pulses.  Pulmonary/Chest: Effort normal and breath sounds normal.  Abdominal: Soft. Bowel sounds are normal.  Musculoskeletal:       Left knee: He exhibits decreased range of motion and swelling.  Neurological: He is alert and oriented to person, place, and time.  Skin: Skin is warm. Capillary refill takes less than 2 seconds.  Psychiatric: He has a normal mood and  affect. His behavior is normal. Judgment and thought content normal.  Nursing note and vitals reviewed.    ED Treatments / Results  Labs (all labs ordered are listed, but only abnormal results are displayed) Labs Reviewed - No data to display  EKG  EKG Interpretation None       Radiology No results found.  Procedures Procedures (including critical care time)  Medications Ordered in ED Medications  ketorolac (TORADOL) 30 MG/ML injection 30 mg (30 mg Intramuscular Given 03/04/17 2144)  lidocaine-EPINEPHrine (XYLOCAINE W/EPI) 2 %-1:100000 (with pres) injection 20 mL (20 mLs Infiltration Given by Other 03/04/17 2144)     Initial Impression / Assessment and Plan / ED Course  I have reviewed the triage vital signs and the nursing notes.  Pertinent labs & imaging results that were available  during my care of the patient were reviewed by me and considered in my medical decision making (see chart for details).    We attempted to drain the knee, but was unsuccessful.  The pt is encouraged to f/u with pcp and with ortho.   Final Clinical Impressions(s) / ED Diagnoses   Final diagnoses:  Acute idiopathic gout of left knee    ED Discharge Orders        Ordered    HYDROcodone-acetaminophen (NORCO/VICODIN) 5-325 MG tablet  Every 4 hours PRN     03/04/17 2306       Jacalyn Lefevre, MD 03/04/17 2307

## 2017-03-04 NOTE — ED Notes (Signed)
ED Provider at bedside. 

## 2017-03-05 NOTE — ED Notes (Signed)
Pt given Rx x 1 for hydrocodone. Ambulatory to d/c window on crutches.

## 2017-03-08 ENCOUNTER — Encounter: Payer: Self-pay | Admitting: Family Medicine

## 2017-03-08 ENCOUNTER — Ambulatory Visit (INDEPENDENT_AMBULATORY_CARE_PROVIDER_SITE_OTHER): Payer: Self-pay | Admitting: Family Medicine

## 2017-03-08 DIAGNOSIS — G8929 Other chronic pain: Secondary | ICD-10-CM

## 2017-03-08 DIAGNOSIS — M25562 Pain in left knee: Secondary | ICD-10-CM

## 2017-03-08 MED ORDER — METHYLPREDNISOLONE ACETATE 40 MG/ML IJ SUSP
40.0000 mg | Freq: Once | INTRAMUSCULAR | Status: AC
  Administered 2017-03-08: 40 mg via INTRA_ARTICULAR

## 2017-03-08 NOTE — Progress Notes (Signed)
PCP: Tarri Fuller, MD  Subjective:   HPI: Patient is a 52 y.o. male here for left knee pain, swelling.  Patient returns with swelling, pain of left knee. Pain level 10/10 and sharp anteriorly. Worse with walking. Using crutches to help get around. He came to ED on 1/20 and they were able to aspirate 50mL of fluid - sent to lab and noted gout crystals but no evidence infection. Returned on 1/27 and attempted aspiration but not able to aspirate any fluid - given toradol and norco. He reports swelling going down to his ankle and foot. No skin changes, numbness.  Past Medical History:  Diagnosis Date  . Gout   . Knee effusion, left     Current Outpatient Medications on File Prior to Visit  Medication Sig Dispense Refill  . HYDROcodone-acetaminophen (NORCO/VICODIN) 5-325 MG tablet Take 1 tablet by mouth every 4 (four) hours as needed. 10 tablet 0  . naproxen (NAPROSYN) 500 MG tablet Take 1 tablet (500 mg total) by mouth 2 (two) times daily. 30 tablet 0  . oxyCODONE-acetaminophen (PERCOCET) 5-325 MG tablet Take 1 tablet every 6 (six) hours as needed by mouth for severe pain. 10 tablet 0   No current facility-administered medications on file prior to visit.     Past Surgical History:  Procedure Laterality Date  . ANTERIOR CRUCIATE LIGAMENT REPAIR      No Known Allergies  Social History   Socioeconomic History  . Marital status: Married    Spouse name: Not on file  . Number of children: Not on file  . Years of education: Not on file  . Highest education level: Not on file  Social Needs  . Financial resource strain: Not on file  . Food insecurity - worry: Not on file  . Food insecurity - inability: Not on file  . Transportation needs - medical: Not on file  . Transportation needs - non-medical: Not on file  Occupational History  . Not on file  Tobacco Use  . Smoking status: Never Smoker  . Smokeless tobacco: Never Used  Substance and Sexual Activity  . Alcohol  use: Yes    Alcohol/week: 1.8 oz    Types: 3 Cans of beer per week    Comment: daily  . Drug use: No  . Sexual activity: Yes    Birth control/protection: Condom  Other Topics Concern  . Not on file  Social History Narrative  . Not on file    History reviewed. No pertinent family history.  BP (!) 145/98   Pulse 90   Ht 5\' 9"  (1.753 m)   Wt 205 lb (93 kg)   BMI 30.27 kg/m   Review of Systems: See HPI above.     Objective:  Physical Exam:  Gen: NAD, comfortable in exam room  Left knee: Large effusion.  Mild warmth.  No erythema, bruising, other deformity. TTP suprapatellar pouch.  No other tenderness. ROM 0 - 90 degrees, 5/5 strength. Negative ant/post drawers. Negative valgus/varus testing. Negative lachmanns. Negative mcmurrays, apleys, patellar apprehension. NV intact distally.  Right knee: No deformity. FROM with 5/5 strength. No tenderness to palpation. NVI distally.   Assessment & Plan:  1. Left knee pain, effusion - 2/2 acute gouty arthritis though on today's attempted aspiration unable to obtain fluid - ultrasound shows hypoechoic fluid and stranding within the joint - ? If this is now purely synovial hypertrophy now.  Injection given into the joint of cortisone.  He will get cone coverage - would consider  MRI to further characterize this if he doesn't improve with cortisone injection.  Ibuprofen or aleve if needed.  After informed written consent timeout was performed, patient was lying supine on exam table.  Left knee was prepped with alcohol swab.  Utilizing superolateral approach, 3 mL of bupivicaine was used for local anesthesia.  Then using an 18g needle on 60cc syringe with ultrasound guidance,1 mL of sanguinous fluid was aspirated from left knee.  Knee was then injected with 3:1 bupivicaine:depomedrol.  Patient tolerated procedure well without immediate complications.

## 2017-03-08 NOTE — Assessment & Plan Note (Signed)
2/2 acute gouty arthritis though on today's attempted aspiration unable to obtain fluid - ultrasound shows hypoechoic fluid and stranding within the joint - ? If this is now purely synovial hypertrophy now.  Injection given into the joint of cortisone.  He will get cone coverage - would consider MRI to further characterize this if he doesn't improve with cortisone injection.  Ibuprofen or aleve if needed.  After informed written consent timeout was performed, patient was lying supine on exam table.  Left knee was prepped with alcohol swab.  Utilizing superolateral approach, 3 mL of bupivicaine was used for local anesthesia.  Then using an 18g needle on 60cc syringe with ultrasound guidance,1 mL of sanguinous fluid was aspirated from left knee.  Knee was then injected with 3:1 bupivicaine:depomedrol.  Patient tolerated procedure well without immediate complications.

## 2017-03-08 NOTE — Patient Instructions (Signed)
Your swelling at this point is due to significant synovial hypertrophy. We gave you a cortisone injection today. Get the cone coverage asap so we can go ahead with MRI and/or orthopedic referral if necessary. Let me know how you're doing in 1 week as the shot should have helped you by that time.

## 2017-03-22 ENCOUNTER — Emergency Department (HOSPITAL_BASED_OUTPATIENT_CLINIC_OR_DEPARTMENT_OTHER)
Admission: EM | Admit: 2017-03-22 | Discharge: 2017-03-22 | Disposition: A | Discharge status: Home / Self Care | Payer: Self-pay | Attending: Emergency Medicine | Admitting: Emergency Medicine

## 2017-03-22 ENCOUNTER — Emergency Department (HOSPITAL_BASED_OUTPATIENT_CLINIC_OR_DEPARTMENT_OTHER): Payer: Self-pay

## 2017-03-22 ENCOUNTER — Other Ambulatory Visit: Payer: Self-pay

## 2017-03-22 ENCOUNTER — Encounter (HOSPITAL_BASED_OUTPATIENT_CLINIC_OR_DEPARTMENT_OTHER): Payer: Self-pay | Admitting: Emergency Medicine

## 2017-03-22 DIAGNOSIS — M109 Gout, unspecified: Secondary | ICD-10-CM | POA: Insufficient documentation

## 2017-03-22 DIAGNOSIS — Z79899 Other long term (current) drug therapy: Secondary | ICD-10-CM | POA: Insufficient documentation

## 2017-03-22 LAB — BASIC METABOLIC PANEL
Anion gap: 12 (ref 5–15)
BUN: 13 mg/dL (ref 6–20)
CALCIUM: 9.5 mg/dL (ref 8.9–10.3)
CO2: 25 mmol/L (ref 22–32)
CREATININE: 0.87 mg/dL (ref 0.61–1.24)
Chloride: 103 mmol/L (ref 101–111)
GLUCOSE: 100 mg/dL — AB (ref 65–99)
Potassium: 4 mmol/L (ref 3.5–5.1)
Sodium: 140 mmol/L (ref 135–145)

## 2017-03-22 MED ORDER — INDOMETHACIN 25 MG PO CAPS: 25.0000 mg | ORAL_CAPSULE | Freq: Three times a day (TID) | ORAL | 0 refills | Status: AC | PRN

## 2017-03-22 MED ORDER — ACETAMINOPHEN 500 MG PO TABS
1000.0000 mg | ORAL_TABLET | Freq: Once | ORAL | Status: AC
  Administered 2017-03-22: 1000 mg via ORAL
  Filled 2017-03-22: qty 2

## 2017-03-22 MED ORDER — KETOROLAC TROMETHAMINE 30 MG/ML IJ SOLN
30.0000 mg | Freq: Once | INTRAMUSCULAR | Status: AC
  Administered 2017-03-22: 30 mg via INTRAMUSCULAR
  Filled 2017-03-22: qty 1

## 2017-03-22 MED FILL — INDOMETHACIN 25 MG CAPSULE: 25 | 3 days supply | Qty: 20 | Fill #0

## 2017-03-22 NOTE — ED Triage Notes (Addendum)
Reports left knee pain.  Seen previously for same.  Reports left ankle swelling as well.  Previously had aspiration of left knee joint.

## 2017-03-22 NOTE — Discharge Instructions (Addendum)
While in the ED your blood pressure was high.  Please follow up with your primary care doctor or the wellness clinic for repeat evaluation as you may need medication.  High blood pressure can cause long term, potentially serious, damage if left untreated.   I have given you information about gout, along with a eating plan.  If you follow this eating plan you can expect a significant decrease in the amount of gout flares that you have.

## 2017-03-23 NOTE — ED Provider Notes (Signed)
[facility] EMERGENCY DEPARTMENT Provider Note   CSN: [id number] Arrival date & time: 03/22/17  5621     History   Chief Complaint Chief Complaint  Patient presents with  . Knee Pain    HPI Lawrence [name] is a 52 y.o. male presents today for evaluation of left knee pain, left ankle pain, and right hand pain.  He reports that his pain has been gradually worsening over the past few days.  He is a long-standing history of gout flares in his left knee and chart review shows that he has had fluid drained off his knee multiple times.  Of the recent times reviewed there does not appear to be any evidence of septic arthritis.  Eyes fevers or chills.  He reports that this pain feels like his normal pain.  He reports that he is not compliant with low purine eating plan. He has been wearing a knee brace with out significant relief.  He has been seen by orthopedics for evaluation of this in the past, has not tried to call them yet for this flare.  HPI  Past Medical History:  Diagnosis Date  . Gout   . Knee effusion, left     Patient Active Problem List   Diagnosis Date Noted  . Left knee pain 03/08/2017  . Bilateral ankle pain 11/29/2015  . Effusion of right knee 03/18/2014    Past Surgical History:  Procedure Laterality Date  . ANTERIOR CRUCIATE LIGAMENT REPAIR         Home Medications    Prior to Admission medications   Medication Sig Start Date End Date Taking? Authorizing Provider  HYDROcodone-acetaminophen (NORCO/VICODIN) 5-325 MG tablet Take 1 tablet by mouth every 4 (four) hours as needed. 03/04/17   Jacalyn Lefevre, MD  indomethacin (INDOCIN) 25 MG capsule Take 1-2 capsules (25-50 mg total) by mouth 3 (three) times daily as needed for mild pain or moderate pain. Please discontinue use as soon as your pain is controlled. 03/22/17   Cristina Gong, PA-C  naproxen (NAPROSYN) 500 MG tablet Take 1 tablet (500 mg total) by mouth 2 (two) times daily. 02/25/17    Khatri, Hina, PA-C  oxyCODONE-acetaminophen (PERCOCET) 5-325 MG tablet Take 1 tablet every 6 (six) hours as needed by mouth for severe pain. 12/17/16   Molpus, Jonny Ruiz, MD    Family History History reviewed. No pertinent family history.  Social History Social History   Tobacco Use  . Smoking status: Never Smoker  . Smokeless tobacco: Never Used  Substance Use Topics  . Alcohol use: Yes    Alcohol/week: 1.8 oz    Types: 3 Cans of beer per week    Comment: daily  . Drug use: No     Allergies   Patient has no known allergies.   Review of Systems Review of Systems  Constitutional: Negative for activity change, chills and fever.  Gastrointestinal: Negative for abdominal pain, rectal pain and vomiting.  Musculoskeletal: Positive for arthralgias and joint swelling.  Neurological: Negative for numbness.  All other systems reviewed and are negative.    Physical Exam Updated Vital Signs BP (!) 157/93   Pulse (!) 102   Temp 98.8 F (37.1 C) (Oral)   Resp 18   Ht 5\' 9"  (1.753 m)   Wt 93 kg (205 lb)   SpO2 100%   BMI 30.27 kg/m   Physical Exam  Constitutional: He appears well-developed and well-nourished. No distress.  HENT:  Head: Normocephalic and atraumatic.  Eyes: Conjunctivae are  normal. Right eye exhibits no discharge. Left eye exhibits no discharge. No scleral icterus.  Neck: Normal range of motion.  Cardiovascular: Normal rate, regular rhythm and intact distal pulses.  2+ DP/PT pulses bilaterally.  Pulmonary/Chest: Effort normal. No stridor. No respiratory distress.  Abdominal: He exhibits no distension.  Musculoskeletal: He exhibits no edema or deformity.  Left knee, and ankle are both swollen with diffuse tenderness to palpation.  There is no obvious erythema, purulent drainage.  There is limited, however symmetrical to the un-affected side, range of motion.    There is swelling along the second and third MCP on the right hand.  There is limited range of motion  to the affected joints secondary to pain.   Neurological: He is alert. He exhibits normal muscle tone.  Skin: Skin is warm and dry. He is not diaphoretic.  There are no obvious wounds over affected joints.   Psychiatric: He has a normal mood and affect. His behavior is normal.  Nursing note and vitals reviewed.    ED Treatments / Results  Labs (all labs ordered are listed, but only abnormal results are displayed) Labs Reviewed  BASIC METABOLIC PANEL - Abnormal; Notable for the following components:      Result Value   Glucose, Bld 100 (*)    All other components within normal limits    EKG  EKG Interpretation None       Radiology [place] Venous Img Lower  Left (dvt Study)  Result Date: 03/22/2017 CLINICAL DATA:  Left lower extremity edema and pain. EXAM: LEFT LOWER EXTREMITY VENOUS DOPPLER ULTRASOUND TECHNIQUE: Gray-scale sonography with graded compression, as well as color Doppler and duplex ultrasound were performed to evaluate the lower extremity deep venous systems from the level of the common femoral vein and including the common femoral, femoral, profunda femoral, popliteal and calf veins including the posterior tibial, peroneal and gastrocnemius veins when visible. The superficial great saphenous vein was also interrogated. Spectral Doppler was utilized to evaluate flow at rest and with distal augmentation maneuvers in the common femoral, femoral and popliteal veins. COMPARISON:  None. FINDINGS: Contralateral Common Femoral Vein: Respiratory phasicity is normal and symmetric with the symptomatic side. No evidence of thrombus. Normal compressibility. Common Femoral Vein: No evidence of thrombus. Normal compressibility, respiratory phasicity and response to augmentation. Saphenofemoral Junction: No evidence of thrombus. Normal compressibility and flow on color Doppler imaging. Profunda Femoral Vein: No evidence of thrombus. Normal compressibility and flow on color Doppler imaging.  Femoral Vein: No evidence of thrombus. Normal compressibility, respiratory phasicity and response to augmentation. Popliteal Vein: No evidence of thrombus. Normal compressibility, respiratory phasicity and response to augmentation. Calf Veins: No evidence of thrombus. Normal compressibility and flow on color Doppler imaging. Superficial Great Saphenous Vein: No evidence of thrombus. Normal compressibility. Venous Reflux: Reflux noted with augmentation at the level of the SFJ and upper segment of the great saphenous vein. Other Findings: Multiple patent superficial varicosities communicate with the great saphenous vein. No evidence of superficial thrombophlebitis. IMPRESSION: 1. No evidence of left lower extremity deep venous thrombosis. 2. Evidence of superficial venous insufficiency [facility]. Associated varicosities are seen communicating with the left great saphenous vein. No evidence of superficial thrombophlebitis. Electronically Signed   By: Irish LackGlenn  Yamagata M.D.   On: 03/22/2017 13:38    Procedures Procedures (including critical care time)  Medications Ordered in ED Medications  acetaminophen (TYLENOL) tablet 1,000 mg (1,000 mg Oral Given 03/22/17 1431)  ketorolac (TORADOL) 30 MG/ML injection  30 mg (30 mg Intramuscular Given 03/22/17 1509)     Initial Impression / Assessment and Plan / ED Course  I have reviewed the triage vital signs and the nursing notes.  Pertinent labs & imaging results that were available during my care of the patient were reviewed by me and considered in my medical decision making (see chart for details).    Pt presents with polyarticular pain, swelling and erythema. Renal function good. Pt without known peptic ulcer disease and not receiving concurrent treatment on warfarin. Pt dc with indomethacin (25-50 mg PO TID). Discussed that pt should respond to treatment with in 24 hour of begining treatment & likely resolve in 2-3 days.  X-rays were not  obtained as this is a known, chronic problem for the patient.  Doppler was obtained due to significant unilateral leg swelling which did not show any DVTs.  Patient instructed to follow-up with orthopedist for further management.  Instructed that he needs to obtain a primary care provider so that he can be started on allopurinol.  He was also given handout on low purine eating plan.  Final Clinical Impressions(s) / ED Diagnoses   Final diagnoses:  Gout of multiple sites, unspecified cause, unspecified chronicity    ED Discharge Orders        Ordered    indomethacin (INDOCIN) 25 MG capsule  3 times daily PRN     03/22/17 1511       Cristina Gong, [place] 03/23/17 1723    Little, Ambrose Finland, MD 03/24/17 1606

## 2017-04-09 ENCOUNTER — Telehealth: Payer: Self-pay | Admitting: Family Medicine

## 2017-04-09 ENCOUNTER — Emergency Department (HOSPITAL_BASED_OUTPATIENT_CLINIC_OR_DEPARTMENT_OTHER)
Admission: EM | Admit: 2017-04-09 | Discharge: 2017-04-09 | Disposition: A | Discharge status: Home / Self Care | Payer: Self-pay | Attending: Emergency Medicine | Admitting: Emergency Medicine

## 2017-04-09 ENCOUNTER — Emergency Department (HOSPITAL_BASED_OUTPATIENT_CLINIC_OR_DEPARTMENT_OTHER): Payer: Self-pay

## 2017-04-09 ENCOUNTER — Encounter (HOSPITAL_BASED_OUTPATIENT_CLINIC_OR_DEPARTMENT_OTHER): Payer: Self-pay | Admitting: Emergency Medicine

## 2017-04-09 ENCOUNTER — Other Ambulatory Visit: Payer: Self-pay

## 2017-04-09 DIAGNOSIS — M109 Gout, unspecified: Secondary | ICD-10-CM | POA: Insufficient documentation

## 2017-04-09 DIAGNOSIS — Z79899 Other long term (current) drug therapy: Secondary | ICD-10-CM | POA: Insufficient documentation

## 2017-04-09 MED ORDER — PREDNISONE 10 MG (48) PO TBPK: ORAL_TABLET | ORAL | 0 refills | Status: AC

## 2017-04-09 MED ORDER — KETOROLAC TROMETHAMINE 60 MG/2ML IM SOLN
INTRAMUSCULAR | Status: AC
  Filled 2017-04-09: qty 2

## 2017-04-09 MED ORDER — OXYCODONE-ACETAMINOPHEN 5-325 MG PO TABS
1.0000 | ORAL_TABLET | Freq: Once | ORAL | Status: DC
  Filled 2017-04-09: qty 1

## 2017-04-09 MED ORDER — KETOROLAC TROMETHAMINE 60 MG/2ML IM SOLN
60.0000 mg | Freq: Once | INTRAMUSCULAR | Status: AC
  Administered 2017-04-09: 60 mg via INTRAMUSCULAR

## 2017-04-09 NOTE — Telephone Encounter (Signed)
Patient informed. 

## 2017-04-09 NOTE — Telephone Encounter (Signed)
I understand it may be difficult for him but if he's worsened since the ED visit we really need to see him in the office before prescribing anything.

## 2017-04-09 NOTE — ED Notes (Signed)
Patient sitting up on the end of the bed and states that he is ready to go.

## 2017-04-09 NOTE — Telephone Encounter (Signed)
Patient was informed.

## 2017-04-09 NOTE — Telephone Encounter (Signed)
I'd refer back to my phone note from his telephone call earlier today.  We cannot prescribe him anything without seeing him especially if he's worsening.  He needs to be seen.

## 2017-04-09 NOTE — Telephone Encounter (Signed)
Patient calling requesting medication.  He states he was in the ED on Feb. 14th because his gout has spread to both big toes and both of his ankles. He states he has been unable to come in for an appointment because he is bedridden due to the pain and swelling.

## 2017-04-09 NOTE — ED Notes (Addendum)
Called to lobby. Pt verbally aggressive with this RN. Apologized for wait. Made pt aware that all pts were being room in order of their arrival. Pt very argumentative and states , "No the fuck you are not." [employer] officer made aware of situation and informed pt that verbal abuse would not be tolerated.

## 2017-04-09 NOTE — Telephone Encounter (Signed)
Patient's mother called to ask if there is anything that the patient can do to help with the gout besides medication. States he is unable to come in for an appointment.   Patient's mother was informed that I can not discuss information with her for no DPR is on file. She was informed I would check with provider and call patient back directly.

## 2017-04-09 NOTE — ED Provider Notes (Signed)
[facility] EMERGENCY DEPARTMENT Provider Note   CSN: [id number] Arrival date & time: 04/09/17  1834     History   Chief Complaint Chief Complaint  Patient presents with  . Foot Pain    bilateral    HPI Lawrence [name] is a 52 y.o. male.  Patient with history of gout. Most recent exacerbation on 2/14. Received a toradol injection on that visit and discharged home with indocin. Patient reports minimal improvement in symptoms. Patient states pain is now in both feet, primarily involving the great toes.   The history is provided by the patient and medical records. No language interpreter was used.  Foot Pain  This is a chronic problem. The current episode started more than 1 week ago. The problem occurs constantly. The problem has not changed since onset.The symptoms are aggravated by walking and standing.    Past Medical History:  Diagnosis Date  . Gout   . Knee effusion, left     Patient Active Problem List   Diagnosis Date Noted  . Left knee pain 03/08/2017  . Bilateral ankle pain 11/29/2015  . Effusion of right knee 03/18/2014    Past Surgical History:  Procedure Laterality Date  . ANTERIOR CRUCIATE LIGAMENT REPAIR         Home Medications    Prior to Admission medications   Medication Sig Start Date End Date Taking? Authorizing Provider  HYDROcodone-acetaminophen (NORCO/VICODIN) 5-325 MG tablet Take 1 tablet by mouth every 4 (four) hours as needed. 03/04/17   Jacalyn LefevreHaviland, Julie, MD  indomethacin (INDOCIN) 25 MG capsule Take 1-2 capsules (25-50 mg total) by mouth 3 (three) times daily as needed for mild pain or moderate pain. Please discontinue use as soon as your pain is controlled. 03/22/17   Cristina GongHammond, Elizabeth W, PA-C  naproxen (NAPROSYN) 500 MG tablet Take 1 tablet (500 mg total) by mouth 2 (two) times daily. 02/25/17   Khatri, Hina, PA-C  oxyCODONE-acetaminophen (PERCOCET) 5-325 MG tablet Take 1 tablet every 6 (six) hours as needed by mouth for severe  pain. 12/17/16   Molpus, Jonny RuizJohn, MD    Family History History reviewed. No pertinent family history.  Social History Social History   Tobacco Use  . Smoking status: Never Smoker  . Smokeless tobacco: Never Used  Substance Use Topics  . Alcohol use: Yes    Alcohol/week: 1.8 oz    Types: 3 Cans of beer per week    Comment: daily  . Drug use: No     Allergies   Patient has no known allergies.   Review of Systems Review of Systems  Musculoskeletal: Positive for arthralgias and joint swelling.  All other systems reviewed and are negative.    Physical Exam Updated Vital Signs BP (!) 144/103 (BP Location: Left Arm)   Pulse 92   Temp 98.2 F (36.8 C) (Oral)   Resp 18   Ht 5\' 9"  (1.753 m)   Wt 93 kg (205 lb)   SpO2 100%   BMI 30.27 kg/m   Physical Exam  Constitutional: He is oriented to person, place, and time. He appears well-developed and well-nourished.  HENT:  Head: Normocephalic.  Eyes: Conjunctivae are normal.  Neck: Neck supple.  Cardiovascular: Normal rate and regular rhythm.  Pulmonary/Chest: Effort normal and breath sounds normal.  Abdominal: Soft. Bowel sounds are normal.  Musculoskeletal: He exhibits tenderness.  Neurological: He is alert and oriented to person, place, and time.  Skin: Skin is warm and dry.  Psychiatric: He has a normal  mood and affect.  Nursing note and vitals reviewed.    ED Treatments / Results  Labs (all labs ordered are listed, but only abnormal results are displayed) Labs Reviewed - No data to display  EKG  EKG Interpretation None       Radiology No results found.  Procedures Procedures (including critical care time)  Medications Ordered in ED Medications  oxyCODONE-acetaminophen (PERCOCET/ROXICET) 5-325 MG per tablet 1 tablet (1 tablet Oral Not Given 04/09/17 2222)  ketorolac (TORADOL) injection 60 mg (60 mg Intramuscular Given 04/09/17 2222)     Initial Impression / Assessment and Plan / ED Course  I have  reviewed the triage vital signs and the nursing notes.  Pertinent labs & imaging results that were available during my care of the patient were reviewed by me and considered in my medical decision making (see chart for details).     Pt presents with articular pain, swelling and erythema. History of gout. Pt is afebrile and stable. Imaging reviewed, no evidence of occult fracture or injury. Pt dc with prednisone taper.  Patient to follow-up with his PCP to discuss/restart allopurinol. Care instructions and diet information provided.  Final Clinical Impressions(s) / ED Diagnoses   Final diagnoses:  Acute gout of foot, unspecified cause, unspecified laterality    ED Discharge Orders        Ordered    predniSONE (STERAPRED UNI-PAK 48 TAB) 10 MG (48) TBPK tablet     04/09/17 2300       Felicie Morn, NP 04/09/17 3244    Arby Barrette, MD 04/13/17 [phone]

## 2017-04-09 NOTE — ED Notes (Signed)
ED Provider at bedside. 

## 2017-04-09 NOTE — ED Notes (Signed)
Went in to give the patient PO medication and patient states that he wants to talk to the DR because he wants a shot. The ;patient reports that it will work faster and longer. Awaiting EDP to see the patient

## 2017-04-09 NOTE — ED Triage Notes (Signed)
Reports bilateral foot pain x 2 months.  States he has been seen previously for same and has not had any relief.  States unable to bear weight on his feet due to pain.

## 2017-05-17 ENCOUNTER — Encounter: Payer: Self-pay | Admitting: Family Medicine

## 2017-05-17 ENCOUNTER — Ambulatory Visit (INDEPENDENT_AMBULATORY_CARE_PROVIDER_SITE_OTHER): Payer: Self-pay | Admitting: Family Medicine

## 2017-05-17 DIAGNOSIS — M10079 Idiopathic gout, unspecified ankle and foot: Secondary | ICD-10-CM

## 2017-05-17 MED ORDER — OXYCODONE-ACETAMINOPHEN 7.5-325 MG PO TABS: 1.0000 | ORAL_TABLET | Freq: Four times a day (QID) | ORAL | 0 refills | Status: AC | PRN

## 2017-05-17 MED ORDER — ALLOPURINOL 300 MG PO TABS: 300.0000 mg | ORAL_TABLET | Freq: Every day | ORAL | 2 refills | Status: DC

## 2017-05-17 MED ORDER — PREDNISONE 10 MG PO TABS
ORAL_TABLET | ORAL | 0 refills | Status: DC
Start: 2017-05-17 — End: 2017-07-16

## 2017-05-17 NOTE — Patient Instructions (Signed)
You have an acute gout flare. Take extended course of prednisone for 12 days. Take percocet as needed for severe pain. In 1 week start the allopurinol as well. Icing 15 minutes at a time 3-4 times a day. Elevate above your heart when possible. If this localizes to just 1-2 joints we can do cortisone injections but hopefully you won't need this.

## 2017-05-19 ENCOUNTER — Encounter: Payer: Self-pay | Admitting: Family Medicine

## 2017-05-19 DIAGNOSIS — M109 Gout, unspecified: Secondary | ICD-10-CM | POA: Insufficient documentation

## 2017-05-19 NOTE — Progress Notes (Signed)
PCP: Tarri FullerEscajeda, Richard, MD  Subjective:   HPI: Patient is a 52 y.o. male here for bilateral ankle pain.  Patient reports he's had bilateral ankle pain and swelling for past 2 months. Had temporary improvement with short course of prednisone. Having 10/10 level pain in ankles and great toes, sharp. Taking ibuprofen. Back in February was given toradol injection and oral indocin. Used crutches. No new injuries. No skin changes, numbness.  Past Medical History:  Diagnosis Date  . Gout   . Knee effusion, left     Current Outpatient Medications on File Prior to Visit  Medication Sig Dispense Refill  . HYDROcodone-acetaminophen (NORCO/VICODIN) 5-325 MG tablet Take 1 tablet by mouth every 4 (four) hours as needed. 10 tablet 0  . indomethacin (INDOCIN) 25 MG capsule Take 1-2 capsules (25-50 mg total) by mouth 3 (three) times daily as needed for mild pain or moderate pain. Please discontinue use as soon as your pain is controlled. 20 capsule 0  . naproxen (NAPROSYN) 500 MG tablet Take 1 tablet (500 mg total) by mouth 2 (two) times daily. 30 tablet 0   No current facility-administered medications on file prior to visit.     Past Surgical History:  Procedure Laterality Date  . ANTERIOR CRUCIATE LIGAMENT REPAIR      No Known Allergies  Social History   Socioeconomic History  . Marital status: Married    Spouse name: Not on file  . Number of children: Not on file  . Years of education: Not on file  . Highest education level: Not on file  Occupational History  . Not on file  Social Needs  . Financial resource strain: Not on file  . Food insecurity:    Worry: Not on file    Inability: Not on file  . Transportation needs:    Medical: Not on file    Non-medical: Not on file  Tobacco Use  . Smoking status: Never Smoker  . Smokeless tobacco: Never Used  Substance and Sexual Activity  . Alcohol use: Yes    Alcohol/week: 1.8 oz    Types: 3 Cans of beer per week    Comment:  daily  . Drug use: No  . Sexual activity: Yes    Birth control/protection: Condom  Lifestyle  . Physical activity:    Days per week: Not on file    Minutes per session: Not on file  . Stress: Not on file  Relationships  . Social connections:    Talks on phone: Not on file    Gets together: Not on file    Attends religious service: Not on file    Active member of club or organization: Not on file    Attends meetings of clubs or organizations: Not on file    Relationship status: Not on file  . Intimate partner violence:    Fear of current or ex partner: Not on file    Emotionally abused: Not on file    Physically abused: Not on file    Forced sexual activity: Not on file  Other Topics Concern  . Not on file  Social History Narrative  . Not on file    History reviewed. No pertinent family history.  BP 130/80   Pulse (!) 118   Ht 5\' 9"  (1.753 m)   Wt 205 lb (93 kg)   BMI 30.27 kg/m   Review of Systems: See HPI above.     Objective:  Physical Exam:  Gen: NAD, comfortable in exam room  Bilateral ankles/feet: Mild swelling and warmth of ankles, 1st MTP joints.  No bruising, other deformity. Pain on passive motion of 1st MTPs, moderate limitation.  Mild limitation ankle motion all directions with 5/5 strength. TTP anterior ankle joint, about bilateral 1st MTPs. Negative ant drawer and talar tilt.   Negative syndesmotic compression. Thompsons test negative. NV intact distally.   Assessment & Plan:  1. Acute gout flare - in ankles and 1st MTPs.  Start extended prednisone with percocet as needed.  Hasn't been able to decrease flare to start allopurinol - will start this in 1 week.  Icing, elevation.  Consider injection.

## 2017-05-19 NOTE — Assessment & Plan Note (Signed)
in ankles and 1st MTPs.  Start extended prednisone with percocet as needed.  Hasn't been able to decrease flare to start allopurinol - will start this in 1 week.  Icing, elevation.  Consider injection.

## 2017-06-15 ENCOUNTER — Encounter: Payer: Self-pay | Admitting: Family Medicine

## 2017-06-15 ENCOUNTER — Ambulatory Visit (INDEPENDENT_AMBULATORY_CARE_PROVIDER_SITE_OTHER): Payer: Self-pay | Admitting: Family Medicine

## 2017-06-15 DIAGNOSIS — G8929 Other chronic pain: Secondary | ICD-10-CM

## 2017-06-15 DIAGNOSIS — M25572 Pain in left ankle and joints of left foot: Secondary | ICD-10-CM

## 2017-06-15 DIAGNOSIS — M79675 Pain in left toe(s): Secondary | ICD-10-CM

## 2017-06-15 DIAGNOSIS — M25571 Pain in right ankle and joints of right foot: Secondary | ICD-10-CM

## 2017-06-15 MED ORDER — METHYLPREDNISOLONE ACETATE 40 MG/ML IJ SUSP
40.0000 mg | Freq: Once | INTRAMUSCULAR | Status: AC
  Administered 2017-06-15: 40 mg via INTRA_ARTICULAR

## 2017-06-15 MED ORDER — METHYLPREDNISOLONE ACETATE 40 MG/ML IJ SUSP
20.0000 mg | Freq: Once | INTRAMUSCULAR | Status: AC
  Administered 2017-06-15: 20 mg via INTRA_ARTICULAR

## 2017-06-15 MED ORDER — HYDROCODONE-ACETAMINOPHEN 5-325 MG PO TABS
1.0000 | ORAL_TABLET | Freq: Four times a day (QID) | ORAL | 0 refills | Status: DC | PRN
Start: 2017-06-15 — End: 2018-07-07

## 2017-06-15 NOTE — Patient Instructions (Signed)
You have an acute gout flare. You were given injections into your ankle joint and the base of the big toe Take norco as needed for severe pain. Continue allopurinol. Icing 15 minutes at a time 3-4 times a day. Elevate above your heart when possible. Follow up with me in 1 month - I don't know there's much else you can do from a conservative standpoint for this as colchicine is very expensive. If this is very severe despite the injections you may have to go to the emergency department.

## 2017-06-18 ENCOUNTER — Encounter: Payer: Self-pay | Admitting: Family Medicine

## 2017-06-18 DIAGNOSIS — M79675 Pain in left toe(s): Secondary | ICD-10-CM | POA: Insufficient documentation

## 2017-06-18 NOTE — Assessment & Plan Note (Signed)
finished extended course of prednisone and currently on allopurinol.  He will continue allopurinol, ice this, given a short course of Norco as needed for severe pain.  Given injections into the most severe joints, the left ankle and left first MTP.  We will follow-up in 1 month.  After informed written consent timeout was performed, patient was seated on exam table. Left ankle was prepped with alcohol swab and utilizing anterolateral approach, patient's left was injected intraarticularly with 2:1 bupivicaine: depomedrol. Patient tolerated the procedure well without immediate complications.

## 2017-06-18 NOTE — Progress Notes (Addendum)
PCP: Tarri Fuller, MD  Subjective:   HPI: Patient is a 52 y.o. male here for bilateral ankle pain.  4/11: Patient reports he's had bilateral ankle pain and swelling for past 2 months. Had temporary improvement with short course of prednisone. Having 10/10 level pain in ankles and great toes, sharp. Taking ibuprofen. Back in February was given toradol injection and oral indocin. Used crutches. No new injuries. No skin changes, numbness.  5/10: Patient reports that he took the prednisone and this helped while he was on it and is currently also taking allopurinol. His left ankle is sore and severe out of 10 on a 10 level and sharp still including left great toe. Right ankle is more bearable though he states it is an 8 out of 10 level. Some associated swelling now just of the left ankle. Pain worse with ambulation. No skin changes or numbness.  Past Medical History:  Diagnosis Date  . Gout   . Knee effusion, left     Current Outpatient Medications on File Prior to Visit  Medication Sig Dispense Refill  . allopurinol (ZYLOPRIM) 300 MG tablet Take 1 tablet (300 mg total) by mouth daily. 30 tablet 2  . indomethacin (INDOCIN) 25 MG capsule Take 1-2 capsules (25-50 mg total) by mouth 3 (three) times daily as needed for mild pain or moderate pain. Please discontinue use as soon as your pain is controlled. 20 capsule 0  . naproxen (NAPROSYN) 500 MG tablet Take 1 tablet (500 mg total) by mouth 2 (two) times daily. 30 tablet 0  . oxyCODONE-acetaminophen (PERCOCET) 7.5-325 MG tablet Take 1 tablet by mouth every 6 (six) hours as needed for severe pain. 20 tablet 0  . predniSONE (DELTASONE) 10 MG tablet 6 tabs po days 1-2, 5 tabs po days 3-4, 4 tabs po days 5-6, 3 tabs po days 7-8, 2 tabs po days 9-10, 1 tab po days 11-12 42 tablet 0   No current facility-administered medications on file prior to visit.     Past Surgical History:  Procedure Laterality Date  . ANTERIOR CRUCIATE  LIGAMENT REPAIR      No Known Allergies  Social History   Socioeconomic History  . Marital status: Married    Spouse name: Not on file  . Number of children: Not on file  . Years of education: Not on file  . Highest education level: Not on file  Occupational History  . Not on file  Social Needs  . Financial resource strain: Not on file  . Food insecurity:    Worry: Not on file    Inability: Not on file  . Transportation needs:    Medical: Not on file    Non-medical: Not on file  Tobacco Use  . Smoking status: Never Smoker  . Smokeless tobacco: Never Used  Substance and Sexual Activity  . Alcohol use: Yes    Alcohol/week: 1.8 oz    Types: 3 Cans of beer per week    Comment: daily  . Drug use: No  . Sexual activity: Yes    Birth control/protection: Condom  Lifestyle  . Physical activity:    Days per week: Not on file    Minutes per session: Not on file  . Stress: Not on file  Relationships  . Social connections:    Talks on phone: Not on file    Gets together: Not on file    Attends religious service: Not on file    Active member of club or organization: Not  on file    Attends meetings of clubs or organizations: Not on file    Relationship status: Not on file  . Intimate partner violence:    Fear of current or ex partner: Not on file    Emotionally abused: Not on file    Physically abused: Not on file    Forced sexual activity: Not on file  Other Topics Concern  . Not on file  Social History Narrative  . Not on file    History reviewed. No pertinent family history.  BP (!) 186/109   Pulse 90   Ht  (1.753 m)   Wt 205 lb (93 kg)   BMI 30.27 kg/m   Review of Systems: See HPI above.     Objective:  Physical Exam:  Gen: NAD, comfortable in exam room  Left ankle: Moderate swelling and warmth including first MTP joint.  No bruising or other deformity. Pain on passive motion of first MTP with moderate limitation on dorsiflexion.  Mild limitation  all ankle motions with 5 out of 5 strength. There is to palpation anterior ankle joint and first MTP joint. Negative anterior drawer and talar tilt. Neurovascular intact distally.  Right ankle: No swelling bruising or other deformity. Mild tenderness to palpation over anterior ankle joint but none first MTP.   Moderate limitation dorsiflexion of the first MTP.  Mild limitation of ankle motions with 5 out of 5 strength. Negative anterior drawer and talar tilt. Neurovascularly intact distally.   Assessment & Plan:  1. Acute gout flare -finished extended course of prednisone and currently on allopurinol.  He will continue allopurinol, ice this, given a short course of Norco as needed for severe pain.  Given injections into the most severe joints, the left ankle and left first MTP.  We will follow-up in 1 month.  After informed written consent timeout was performed, patient was seated on exam table. Left ankle was prepped with alcohol swab and utilizing anterolateral approach, patient's left was injected intraarticularly with 2:1 bupivicaine: depomedrol. Patient tolerated the procedure well without immediate complications.  After informed written consent, timeout was performed, patient was seated on exam table.  Left first MTP joint was identified with ultrasound, prepped with alcohol swab, then injected with 0.5-0.5 bupivacaine to Depo-Medrol.  Patient tolerated procedure well without immediate complications.

## 2017-06-18 NOTE — Assessment & Plan Note (Signed)
After informed written consent, timeout was performed, patient was seated on exam table.  Left first MTP joint was identified with ultrasound, prepped with alcohol swab, then injected with 0.5-0.5 bupivacaine to Depo-Medrol.  Patient tolerated procedure well without immediate complications.

## 2017-07-16 ENCOUNTER — Ambulatory Visit: Payer: Self-pay | Admitting: Family Medicine

## 2017-07-16 ENCOUNTER — Encounter: Payer: Self-pay | Admitting: Family Medicine

## 2017-07-16 ENCOUNTER — Ambulatory Visit (INDEPENDENT_AMBULATORY_CARE_PROVIDER_SITE_OTHER): Payer: Self-pay | Admitting: Family Medicine

## 2017-07-16 DIAGNOSIS — M10079 Idiopathic gout, unspecified ankle and foot: Secondary | ICD-10-CM

## 2017-07-16 MED ORDER — PREDNISONE 20 MG PO TABS: 20.0000 mg | ORAL_TABLET | Freq: Two times a day (BID) | ORAL | 0 refills | Status: DC

## 2017-07-16 NOTE — Patient Instructions (Signed)
Try prednisone 20mg  daily with food. Follow up with me in 2 weeks for reevaluation.

## 2017-07-16 NOTE — Progress Notes (Signed)
PCP: Tarri Fuller, MD  Subjective:   HPI: Patient is a 52 y.o. male here for bilateral ankle pain.  4/11: Patient reports he's had bilateral ankle pain and swelling for past 2 months. Had temporary improvement with short course of prednisone. Having 10/10 level pain in ankles and great toes, sharp. Taking ibuprofen. Back in February was given toradol injection and oral indocin. Used crutches. No new injuries. No skin changes, numbness.  5/10: Patient reports that he took the prednisone and this helped while he was on it and is currently also taking allopurinol. His left ankle is sore and severe out of 10 on a 10 level and sharp still including left great toe. Right ankle is more bearable though he states it is an 8 out of 10 level. Some associated swelling now just of the left ankle. Pain worse with ambulation. No skin changes or numbness.  6/10: Patient reports he feels a little better compared to last visit. Still reporting 9/10 level pain in left ankle and foot though. Slight swelling. Taking tylenol. No skin changes, numbness.  Past Medical History:  Diagnosis Date  . Gout   . Knee effusion, left     Current Outpatient Medications on File Prior to Visit  Medication Sig Dispense Refill  . allopurinol (ZYLOPRIM) 300 MG tablet Take 1 tablet (300 mg total) by mouth daily. 30 tablet 2  . HYDROcodone-acetaminophen (NORCO) 5-325 MG tablet Take 1 tablet by mouth every 6 (six) hours as needed for moderate pain. 10 tablet 0  . indomethacin (INDOCIN) 25 MG capsule Take 1-2 capsules (25-50 mg total) by mouth 3 (three) times daily as needed for mild pain or moderate pain. Please discontinue use as soon as your pain is controlled. 20 capsule 0  . naproxen (NAPROSYN) 500 MG tablet Take 1 tablet (500 mg total) by mouth 2 (two) times daily. 30 tablet 0  . oxyCODONE-acetaminophen (PERCOCET) 7.5-325 MG tablet Take 1 tablet by mouth every 6 (six) hours as needed for severe pain. 20  tablet 0   No current facility-administered medications on file prior to visit.     Past Surgical History:  Procedure Laterality Date  . ANTERIOR CRUCIATE LIGAMENT REPAIR      No Known Allergies  Social History   Socioeconomic History  . Marital status: Married    Spouse name: Not on file  . Number of children: Not on file  . Years of education: Not on file  . Highest education level: Not on file  Occupational History  . Not on file  Social Needs  . Financial resource strain: Not on file  . Food insecurity:    Worry: Not on file    Inability: Not on file  . Transportation needs:    Medical: Not on file    Non-medical: Not on file  Tobacco Use  . Smoking status: Never Smoker  . Smokeless tobacco: Never Used  Substance and Sexual Activity  . Alcohol use: Yes    Alcohol/week: 1.8 oz    Types: 3 Cans of beer per week    Comment: daily  . Drug use: No  . Sexual activity: Yes    Birth control/protection: Condom  Lifestyle  . Physical activity:    Days per week: Not on file    Minutes per session: Not on file  . Stress: Not on file  Relationships  . Social connections:    Talks on phone: Not on file    Gets together: Not on file  Attends religious service: Not on file    Active member of club or organization: Not on file    Attends meetings of clubs or organizations: Not on file    Relationship status: Not on file  . Intimate partner violence:    Fear of current or ex partner: Not on file    Emotionally abused: Not on file    Physically abused: Not on file    Forced sexual activity: Not on file  Other Topics Concern  . Not on file  Social History Narrative  . Not on file    History reviewed. No pertinent family history.  BP (!) 167/114   Pulse 92   Ht 5\' 9"  (1.753 m)   Wt 205 lb (93 kg)   BMI 30.27 kg/m   Review of Systems: See HPI above.     Objective:  Physical Exam:  Gen: NAD, comfortable in exam room  Left ankle: Minimal swelling of  ankle and 1st MTP joint now without warmth.  No bruising.  Hallux rigidus.   Pain on passive flexion and extension of first MTP.  Still with limitation of ankle motions but 5/5 strength all directions. TTP mildly of 1st MTP joint.  No TTP of ankle.  Mild tenderness of midfoot. Negative ant drawer and talar tilt.   Negative syndesmotic compression. Thompsons test negative. NV intact distally.   Assessment & Plan:  1. Acute gout flare - He has improved following injections of left ankle and left 1st MTP though still reporting pain with acute gout flare.  He is taking allopurinol.  Colchicine too expensive.  Unfortunately options limited - will start prednisone 20mg  daily with food.  We discussed risks of elevated blood sugars, blood pressure, osteoporosis, mood changes, increased appetite.  F/u in 2 weeks.

## 2017-07-16 NOTE — Assessment & Plan Note (Signed)
He has improved following injections of left ankle and left 1st MTP though still reporting pain with acute gout flare.  He is taking allopurinol.  Colchicine too expensive.  Unfortunately options limited - will start prednisone 20mg  daily with food.  We discussed risks of elevated blood sugars, blood pressure, osteoporosis, mood changes, increased appetite.  F/u in 2 weeks.

## 2017-07-30 ENCOUNTER — Ambulatory Visit (INDEPENDENT_AMBULATORY_CARE_PROVIDER_SITE_OTHER): Payer: Self-pay | Admitting: Family Medicine

## 2017-07-30 ENCOUNTER — Encounter: Payer: Self-pay | Admitting: Family Medicine

## 2017-07-30 DIAGNOSIS — M10079 Idiopathic gout, unspecified ankle and foot: Secondary | ICD-10-CM

## 2017-07-30 MED ORDER — ALLOPURINOL 300 MG PO TABS: 300.0000 mg | ORAL_TABLET | Freq: Every day | ORAL | 2 refills | Status: AC

## 2017-07-30 NOTE — Patient Instructions (Signed)
Decrease the prednisone to just 1 tablet daily (20mg  daily). Call me in 2 weeks to let me know how you're doing. We want to get you on the lowest possible dose that also helps with your gout.

## 2017-07-30 NOTE — Progress Notes (Signed)
PCP: Tarri FullerEscajeda, Richard, MD  Subjective:   HPI: Patient is a 52 y.o. male here for bilateral ankle pain.  4/11: Patient reports he's had bilateral ankle pain and swelling for past 2 months. Had temporary improvement with short course of prednisone. Having 10/10 level pain in ankles and great toes, sharp. Taking ibuprofen. Back in February was given toradol injection and oral indocin. Used crutches. No new injuries. No skin changes, numbness.  5/10: Patient reports that he took the prednisone and this helped while he was on it and is currently also taking allopurinol. His left ankle is sore and severe out of 10 on a 10 level and sharp still including left great toe. Right ankle is more bearable though he states it is an 8 out of 10 level. Some associated swelling now just of the left ankle. Pain worse with ambulation. No skin changes or numbness.  6/10: Patient reports he feels a little better compared to last visit. Still reporting 9/10 level pain in left ankle and foot though. Slight swelling. Taking tylenol. No skin changes, numbness.  6/24: Patient reports he's doing better since last visit. He's taking prednisone 20mg  twice a day with food. Pain level 0/10 and swelling much better. No skin changes, numbness.  Past Medical History:  Diagnosis Date  . Gout   . Knee effusion, left     Current Outpatient Medications on File Prior to Visit  Medication Sig Dispense Refill  . HYDROcodone-acetaminophen (NORCO) 5-325 MG tablet Take 1 tablet by mouth every 6 (six) hours as needed for moderate pain. 10 tablet 0  . indomethacin (INDOCIN) 25 MG capsule Take 1-2 capsules (25-50 mg total) by mouth 3 (three) times daily as needed for mild pain or moderate pain. Please discontinue use as soon as your pain is controlled. 20 capsule 0  . naproxen (NAPROSYN) 500 MG tablet Take 1 tablet (500 mg total) by mouth 2 (two) times daily. 30 tablet 0  . oxyCODONE-acetaminophen (PERCOCET)  7.5-325 MG tablet Take 1 tablet by mouth every 6 (six) hours as needed for severe pain. 20 tablet 0  . predniSONE (DELTASONE) 20 MG tablet Take 1 tablet (20 mg total) by mouth 2 (two) times daily. 30 tablet 0   No current facility-administered medications on file prior to visit.     Past Surgical History:  Procedure Laterality Date  . ANTERIOR CRUCIATE LIGAMENT REPAIR      No Known Allergies  Social History   Socioeconomic History  . Marital status: Married    Spouse name: Not on file  . Number of children: Not on file  . Years of education: Not on file  . Highest education level: Not on file  Occupational History  . Not on file  Social Needs  . Financial resource strain: Not on file  . Food insecurity:    Worry: Not on file    Inability: Not on file  . Transportation needs:    Medical: Not on file    Non-medical: Not on file  Tobacco Use  . Smoking status: Never Smoker  . Smokeless tobacco: Never Used  Substance and Sexual Activity  . Alcohol use: Yes    Alcohol/week: 1.8 oz    Types: 3 Cans of beer per week    Comment: daily  . Drug use: No  . Sexual activity: Yes    Birth control/protection: Condom  Lifestyle  . Physical activity:    Days per week: Not on file    Minutes per session: Not on  file  . Stress: Not on file  Relationships  . Social connections:    Talks on phone: Not on file    Gets together: Not on file    Attends religious service: Not on file    Active member of club or organization: Not on file    Attends meetings of clubs or organizations: Not on file    Relationship status: Not on file  . Intimate partner violence:    Fear of current or ex partner: Not on file    Emotionally abused: Not on file    Physically abused: Not on file    Forced sexual activity: Not on file  Other Topics Concern  . Not on file  Social History Narrative  . Not on file    History reviewed. No pertinent family history.  BP (!) 153/96   Pulse 77   Ht 5\' 9"   (1.753 m)   Wt 205 lb (93 kg)   BMI 30.27 kg/m   Review of Systems: See HPI above.     Objective:  Physical Exam:  Gen: NAD, comfortable in exam room  Left ankle/foot: No swelling, bruising.  Hallux rigidus.   Mild limitation all ankle motions but 5/5 strength.   No TTP NV intact distally.  Right ankle/foot: No swelling, bruising.  Hallux rigidus.   Mild limitation all ankle motions but 5/5 strength.   No TTP NV intact distally.   Assessment & Plan:  1. Acute gout flare - with several recurrent flares.  S/p injection of left ankle and 1st MTP but still had pain, some swelling.  Started on prednisone 20mg  but taking twice a day - will decrease to once a day for 2 weeks and call us with how he's doing - hopefully can decrease to 10mg  - want to get to lowest effective dose.  Colchicine too expensive.  Again reviewed risks of increased blood pressure, blood sugar, osteoporosis using prednisone long term.  Continue colchicine.

## 2017-07-30 NOTE — Assessment & Plan Note (Signed)
with several recurrent flares.  S/p injection of left ankle and 1st MTP but still had pain, some swelling.  Started on prednisone 20mg  but taking twice a day - will decrease to once a day for 2 weeks and call us with how he's doing - hopefully can decrease to 10mg  - want to get to lowest effective dose.  Colchicine too expensive.  Again reviewed risks of increased blood pressure, blood sugar, osteoporosis using prednisone long term.  Continue colchicine.

## 2018-07-07 ENCOUNTER — Encounter (HOSPITAL_BASED_OUTPATIENT_CLINIC_OR_DEPARTMENT_OTHER): Payer: Self-pay | Admitting: *Deleted

## 2018-07-07 ENCOUNTER — Emergency Department (HOSPITAL_BASED_OUTPATIENT_CLINIC_OR_DEPARTMENT_OTHER)
Admission: EM | Admit: 2018-07-07 | Discharge: 2018-07-07 | Disposition: A | Discharge status: Home / Self Care | Payer: No Typology Code available for payment source | Attending: Emergency Medicine | Admitting: Emergency Medicine

## 2018-07-07 ENCOUNTER — Other Ambulatory Visit: Payer: Self-pay

## 2018-07-07 ENCOUNTER — Emergency Department (HOSPITAL_BASED_OUTPATIENT_CLINIC_OR_DEPARTMENT_OTHER): Payer: No Typology Code available for payment source

## 2018-07-07 DIAGNOSIS — M25561 Pain in right knee: Secondary | ICD-10-CM | POA: Insufficient documentation

## 2018-07-07 DIAGNOSIS — Z79899 Other long term (current) drug therapy: Secondary | ICD-10-CM | POA: Diagnosis not present

## 2018-07-07 DIAGNOSIS — S20212A Contusion of left front wall of thorax, initial encounter: Secondary | ICD-10-CM

## 2018-07-07 DIAGNOSIS — Y92410 Unspecified street and highway as the place of occurrence of the external cause: Secondary | ICD-10-CM | POA: Insufficient documentation

## 2018-07-07 DIAGNOSIS — F17210 Nicotine dependence, cigarettes, uncomplicated: Secondary | ICD-10-CM | POA: Insufficient documentation

## 2018-07-07 DIAGNOSIS — Y9389 Activity, other specified: Secondary | ICD-10-CM | POA: Diagnosis not present

## 2018-07-07 DIAGNOSIS — Y999 Unspecified external cause status: Secondary | ICD-10-CM | POA: Diagnosis not present

## 2018-07-07 DIAGNOSIS — S299XXA Unspecified injury of thorax, initial encounter: Secondary | ICD-10-CM | POA: Diagnosis present

## 2018-07-07 MED ORDER — HYDROCODONE-ACETAMINOPHEN 5-325 MG PO TABS: 1.0000 | ORAL_TABLET | ORAL | 0 refills | Status: AC | PRN

## 2018-07-07 MED ORDER — HYDROCODONE-ACETAMINOPHEN 5-325 MG PO TABS: 1.0000 | ORAL_TABLET | ORAL | 0 refills | Status: DC | PRN

## 2018-07-07 MED ORDER — LIDOCAINE 5 % EX PTCH: 1.0000 | MEDICATED_PATCH | CUTANEOUS | 0 refills | Status: DC

## 2018-07-07 MED ORDER — LIDOCAINE 5 % EX PTCH: 1.0000 | MEDICATED_PATCH | CUTANEOUS | 0 refills | Status: AC

## 2018-07-07 MED ORDER — HYDROCODONE-ACETAMINOPHEN 5-325 MG PO TABS
1.0000 | ORAL_TABLET | Freq: Once | ORAL | Status: AC
  Administered 2018-07-07: 15:00:00 1 via ORAL
  Filled 2018-07-07: qty 1
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
## 2018-07-07 NOTE — ED Notes (Signed)
Patient refused to do IS. Stated he knew how and he would take it home

## 2018-07-07 NOTE — ED Notes (Signed)
ED Provider at bedside. 
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
## 2021-09-06 ENCOUNTER — Emergency Department (HOSPITAL_BASED_OUTPATIENT_CLINIC_OR_DEPARTMENT_OTHER)
Admission: EM | Admit: 2021-09-06 | Discharge: 2021-09-06 | Disposition: A | Discharge status: Home / Self Care | Payer: Medicare Other | Attending: Emergency Medicine | Admitting: Emergency Medicine

## 2021-09-06 ENCOUNTER — Other Ambulatory Visit (HOSPITAL_BASED_OUTPATIENT_CLINIC_OR_DEPARTMENT_OTHER): Payer: Self-pay

## 2021-09-06 ENCOUNTER — Encounter (HOSPITAL_BASED_OUTPATIENT_CLINIC_OR_DEPARTMENT_OTHER): Payer: Self-pay

## 2021-09-06 ENCOUNTER — Other Ambulatory Visit: Payer: Self-pay

## 2021-09-06 DIAGNOSIS — T7840XA Allergy, unspecified, initial encounter: Secondary | ICD-10-CM | POA: Diagnosis not present

## 2021-09-06 MED ORDER — METHYLPREDNISOLONE SODIUM SUCC 125 MG IJ SOLR
125.0000 mg | Freq: Once | INTRAMUSCULAR | Status: AC
  Administered 2021-09-06: 125 mg via INTRAVENOUS
  Filled 2021-09-06: qty 2

## 2021-09-06 MED ORDER — PREDNISONE 50 MG PO TABS: 50.0000 mg | ORAL_TABLET | Freq: Every day | ORAL | 0 refills | Status: DC

## 2021-09-06 MED ORDER — FAMOTIDINE 20 MG PO TABS
20.0000 mg | ORAL_TABLET | Freq: Once | ORAL | Status: AC
  Administered 2021-09-06: 20 mg via ORAL
  Filled 2021-09-06: qty 1

## 2021-09-06 MED ORDER — LACTATED RINGERS IV BOLUS
1000.0000 mL | Freq: Once | INTRAVENOUS | Status: AC
  Administered 2021-09-06: 1000 mL via INTRAVENOUS

## 2021-09-06 MED ORDER — PREDNISONE 50 MG PO TABS
50.0000 mg | ORAL_TABLET | Freq: Every day | ORAL | 0 refills | Status: AC
  Filled 2021-09-06: qty 5, 5d supply, fill #0

## 2021-09-06 NOTE — ED Notes (Signed)
Per pt he has had this reaction before from the hair products they   has used before and had to take meds states he took 3 pink pills(benadryl) at 730 this am , face is swollen esp rt eye, pt is handling secretions well, feels like his voice is hoarse but no sob

## 2021-09-06 NOTE — Discharge Instructions (Signed)
You were treated here for an allergic reaction.  You can use some steroids and Pepcid which will help with the itching and swelling to your face.  I have sent you in a course of steroids that you will take over the next 5 days-please take your first dose tomorrow.  I would also recommend continuing to take the Benadryl and over-the-counter famotidine for the next 24 to 48 hours to help with your symptoms.  Continue with ice packs over your eyes for symptom relief.  Please avoid coloring your hair in the future as there is risk of you going into anaphylaxis if you have another allergic reaction.

## 2021-09-06 NOTE — ED Triage Notes (Addendum)
States got hair colored Saturday, since then started having allergic reaction. Swelling to eyes and face. Itching. Denies difficulty swallowing/breathing. Voice is hoarse.

## 2021-09-06 NOTE — ED Provider Notes (Signed)
[facility] EMERGENCY DEPARTMENT Provider Note   CSN: [id number] Arrival date & time: 09/06/21  4854     History  Chief Complaint  Patient presents with   Allergic Reaction    Lawrence [name] is a 56 y.o. male with history of gout presents to the ED for evaluation of an allergic reaction. Patient states that he got his hair colored on Saturday night, 3 days ago.  The next morning, he had swelling to his eyes and to his face along with full body itching.  He denies swelling of the throat, tongue, shortness of breath, nausea, vomiting or diarrhea.  He states he has been icing his eyes, however the swelling does not seem to be significantly improving.  He is also taken some Benadryl.  He took 75 mg last night and 75 mg 1 hour prior to arrival.  He states that this has happened before when he has tried coloring his hair in the past.  No history of anaphylaxis or use of an EpiPen.  Denies chest pain, wheezing, rash.   Allergic Reaction Presenting symptoms: no difficulty swallowing and no rash        Home Medications Prior to Admission medications   Medication Sig Start Date End Date Taking? Authorizing Provider  allopurinol (ZYLOPRIM) 300 MG tablet Take 1 tablet (300 mg total) by mouth daily. 07/30/17   Hudnall, Azucena Fallen, MD  indomethacin (INDOCIN) 25 MG capsule Take 1-2 capsules (25-50 mg total) by mouth 3 (three) times daily as needed for mild pain or moderate pain. Please discontinue use as soon as your pain is controlled. 03/22/17   Cristina Gong, PA-C  lidocaine (LIDODERM) 5 % Place 1 patch onto the skin daily. Remove & Discard patch within 12 hours or as directed by MD 07/07/18   Curatolo, Adam, DO  naproxen (NAPROSYN) 500 MG tablet Take 1 tablet (500 mg total) by mouth 2 (two) times daily. 02/25/17   Khatri, Hina, PA-C  oxyCODONE-acetaminophen (PERCOCET) 7.5-325 MG tablet Take 1 tablet by mouth every 6 (six) hours as needed for severe pain. 05/17/17   Hudnall, Azucena Fallen, MD   predniSONE (DELTASONE) 50 MG tablet Take 1 tablet (50 mg total) by mouth daily for 5 days. 09/07/21 09/12/21  Janell Quiet, PA-C      Allergies    Patient has no known allergies.    Review of Systems   Review of Systems  Constitutional:  Negative for fever.  HENT:  Positive for facial swelling. Negative for trouble swallowing.   Respiratory:  Negative for chest tightness and shortness of breath.   Cardiovascular:  Negative for chest pain.  Gastrointestinal:  Negative for nausea and vomiting.  Skin:  Negative for rash.    Physical Exam Updated Vital Signs BP (!) 152/108 (BP Location: Left Arm)   Pulse 80   Temp 98.1 F (36.7 C) (Oral)   Resp 19   Ht 5\' 9"  (1.753 m)   Wt 94.8 kg   SpO2 99%   BMI 30.86 kg/m  Physical Exam Vitals and nursing note reviewed.  Constitutional:      General: He is not in acute distress.    Appearance: He is not ill-appearing.  HENT:     Head: Atraumatic.     Comments: Bilateral periorbital swelling, worse on the right side.  Sclera is white, noninjected without discharge.  Swelling noted to the bilateral cheeks  Oropharynx is clear without swelling, erythema.  Uvula is midline.  Airway intact. Eyes:  Conjunctiva/sclera: Conjunctivae normal.  Cardiovascular:     Rate and Rhythm: Normal rate and regular rhythm.     Pulses: Normal pulses.     Heart sounds: No murmur heard. Pulmonary:     Effort: Pulmonary effort is normal. No respiratory distress.     Breath sounds: Normal breath sounds.  Abdominal:     General: Abdomen is flat. There is no distension.     Palpations: Abdomen is soft.     Tenderness: There is no abdominal tenderness.  Musculoskeletal:        General: Normal range of motion.     Cervical back: Normal range of motion.  Skin:    General: Skin is warm and dry.     Capillary Refill: Capillary refill takes less than 2 seconds.     Findings: No rash. Rash is not urticarial.  Neurological:     General: No focal deficit  present.     Mental Status: He is alert.  Psychiatric:        Mood and Affect: Mood normal.     ED Results / Procedures / Treatments   Labs (all labs ordered are listed, but only abnormal results are displayed) Labs Reviewed - No data to display  EKG None  Radiology No results found.  Procedures Procedures    Medications Ordered in ED Medications  methylPREDNISolone sodium succinate (SOLU-MEDROL) 125 mg/2 mL injection 125 mg (125 mg Intravenous Given 09/06/21 0930)  famotidine (PEPCID) tablet 20 mg (20 mg Oral Given 09/06/21 0930)  lactated ringers bolus 1,000 mL (1,000 mLs Intravenous New Bag/Given 09/06/21 8315)    ED Course/ Medical Decision Making/ A&P                           Medical Decision Making Risk Prescription drug management.   56 year old male presents emergency department for evaluation of allergic reaction that started 2 days ago.  Vitals are without significant abnormality.  Heart rate is normal and he is satting well on room air 99%.  No wheezes rales or rhonchi on auscultation.  No urticaria or rash noted.  There is periorbital swelling bilaterally, worse on the right.  Uvula is midline without oropharyngeal swelling.  Airway is intact.  Symptoms not consistent with anaphylaxis.  He had Benadryl 1 hour prior to arrival, so we will give 125 mg Solu-Medrol and Pepcid 20 mg with IV fluids and monitor.  On reevaluation, patient was reporting symptomatic improvement with itching.  He thinks some of the swelling is also starting to come down.  I have symptoms home with prednisone to take starting tomorrow for 5 days.  Advised to continue taking Benadryl and Pepcid for the next 24 to 48 hours.  Return precautions discussed.  Advised to discontinue collar and is here to prevent anaphylactic reaction.  Patient expresses understanding and is amenable to plan.  Discharged home in stable condition.   Final Clinical Impression(s) / ED Diagnoses Final diagnoses:  Allergic  reaction, initial encounter    Rx / DC Orders ED Discharge Orders          Ordered    predniSONE (DELTASONE) 50 MG tablet  Daily,   Status:  Discontinued        09/06/21 0955    predniSONE (DELTASONE) 50 MG tablet  Daily        09/06/21 1013              Janell Quiet, [place] 09/06/21 1014  Glynn Octave, MD 09/06/21 [phone]
